# Patient Record
Sex: Female | Born: 1950 | Race: Black or African American | Hispanic: No | Marital: Married | Smoking: Current every day smoker
Health system: Southern US, Community
[De-identification: ages and names within clinical notes are randomized; demographics above are authoritative.]

## PROBLEM LIST (undated history)

## (undated) DIAGNOSIS — I10 Essential (primary) hypertension: Secondary | ICD-10-CM

## (undated) DIAGNOSIS — R319 Hematuria, unspecified: Secondary | ICD-10-CM

## (undated) DIAGNOSIS — J45909 Unspecified asthma, uncomplicated: Secondary | ICD-10-CM

## (undated) HISTORY — PX: CYST REMOVAL HAND: SHX6279

## (undated) HISTORY — PX: ABDOMINAL HYSTERECTOMY: SHX81

---

## 2000-03-09 HISTORY — PX: OTHER SURGICAL HISTORY: SHX169

## 2003-03-10 HISTORY — PX: TOTAL KNEE ARTHROPLASTY: SHX125

## 2004-03-09 HISTORY — PX: BREAST LUMPECTOMY: SHX2

## 2011-11-11 ENCOUNTER — Other Ambulatory Visit: Payer: Self-pay | Admitting: Urology

## 2011-11-20 ENCOUNTER — Encounter (HOSPITAL_BASED_OUTPATIENT_CLINIC_OR_DEPARTMENT_OTHER): Payer: Self-pay | Admitting: *Deleted

## 2011-11-20 NOTE — Progress Notes (Addendum)
NPO AFTER MN. ARRIVES AT 0715. NEEDS ISTAT AND EKG. WILL TAKE SINGULAIR AND DO ADVAIR INHALER AM OF SURG W/ SIPS OF WATER.  FYI:  PT STATES HAS SMALL  VEINS AND HAS BEEN HARD STICK IN THE PAST.

## 2011-11-24 NOTE — H&P (Signed)
ctive Problems Problems  1. Abdominal Pain In The Right Upper Belly (RUQ) 789.01 2. Microscopic Hematuria 599.72  History of Present Illness     Kristy Young returns today in f/u from her CT scan for possible cystoscopy for evaluation of her hematuria.  A cytology was negative.  I reviewed the CT and am concerned about some attenuation of the right renal pelvis that may be associated with a hilar mass effect.   She has no voiding symptoms at this time.  She had some right upper abdominal tenderness on her prior exam but has no complaints of pain today.  Her UA today has 11-20 RBC's.   Past Medical History Problems  1. History of  Asthma 493.90 2. History of  Hypercholesterolemia 272.0 3. History of  Hypertension 401.9  Surgical History Problems  1. History of  Breast Surgery 2. History of  Hysterectomy V45.77 3. History of  Knee Replacement  Current Meds 1. Advair Diskus AEPB; Therapy: (Recorded:31Jul2013) to 2. Albuterol Sulfate NEBU; Therapy: (Recorded:31Jul2013) to 3. Losartan Potassium-HCTZ 50-12.5 MG Oral Tablet; Therapy: (Recorded:31Jul2013) to 4. Singulair 10 MG Oral Tablet; Therapy: (Recorded:31Jul2013) to  Allergies Medication  1. Sulfa Drugs  Family History Problems  1. Family history of  Family Health Status Number Of Children 2 sons and 1 daughter 2. Paternal grandfather's history of  Heart Disease V17.49 3. Paternal history of  Lung Cancer V16.1 4. Maternal history of  Lung Surgery  Social History Problems  1. Being A Social Drinker 2. Caffeine Use 2 per day 3. Marital History - Single 4. Occupation: Print production planner 5. Tobacco Use 305.1 Smokes about 1/2 ppd for 15 yrs   She continues to smoke but is trying to cut back.   Review of Systems  Genitourinary: no hematuria.  Cardiovascular: no chest pain.  Respiratory: no shortness of breath.  Musculoskeletal: back pain.    Vitals Vital Signs [Data Includes: Last 1 Day]  04Sep2013 08:52AM  Blood Pressure:  143 / 75 Temperature: 97 F Heart Rate: 71  Physical Exam Constitutional: Well nourished and well developed . No acute distress.  Pulmonary: No respiratory distress and normal respiratory rhythm and effort.  Cardiovascular: Heart rate and rhythm are normal . No peripheral edema.    Results/Data Urine [Data Includes: Last 1 Day]   04Sep2013  COLOR YELLOW   APPEARANCE CLEAR   SPECIFIC GRAVITY 1.025   pH 6.0   GLUCOSE NEG mg/dL  BILIRUBIN NEG   KETONE NEG mg/dL  BLOOD LARGE   PROTEIN NEG mg/dL  UROBILINOGEN 0.2 mg/dL  NITRITE NEG   LEUKOCYTE ESTERASE NEG   SQUAMOUS EPITHELIAL/HPF NONE SEEN   WBC NONE SEEN WBC/hpf  RBC 11-20 RBC/hpf  BACTERIA NONE SEEN   CRYSTALS NONE SEEN   CASTS NONE SEEN   Other MUCUS    The following images/tracing/specimen were independently visualized:  I have reviewed the CT films and I am concerned about some attenuation of the right collecting system, particularly the pelvis, which is a bit ratty in appearance and could be associated with a hilar mass effect. Selected Results  AU CT-HEMATURIA PROTOCOL 01Aug2013 12:00AM Bjorn Pippin   Test Name Result Flag Reference  ** RADIOLOGY REPORT BY [facility] ** ORIGINAL APPROVED BY: P. Loralie Champagne, M.D. ON: 10/08/2011 16:48:37   *RADIOLOGY REPORT*  Clinical Data: Right flank pain and hematuria.  CT ABDOMEN AND PELVIS WITHOUT AND WITH CONTRAST  Technique: Multidetector CT imaging of the abdomen and pelvis was performed without contrast material in one or both  body regions, followed by contrast material(s) and further sections in one or both body regions.  Contrast: 125 ml Isovue 300.  Comparison: None  Findings: The lung bases are clear. No pulmonary nodule or pleural effusion.  No renal, ureteral or bladder calculi. After contrast administration both kidneys demonstrate normal perfusion. There are small bilateral simple appearing renal cysts. No worrisome renal mass lesions. The  delayed images demonstrate no worrisome collecting system abnormalities. Both ureters are visualized all the way down to the bladder and are unremarkable. The bladder is normal. No bladder mass or bladder wall thickening.  The liver is unremarkable. No focal lesions or intrahepatic biliary dilatation. The gallbladder is mildly contracted. No common bile duct dilatation. The pancreatic duct is within normal limits in caliber and has a normal course. No pancreatic lesions. The spleen is normal in size. No focal lesions. Small bilateral adrenal gland nodules are consistent with benign adenomas.  The stomach, duodenum, small bowel and colon are unremarkable. No mass lesions or inflammatory changes. The appendix is normal. No mesenteric or retroperitoneal mass or adenopathy. The aorta demonstrates advanced atherosclerotic calcifications but no focal aneurysm or dissection. The major branch vessels are patent. Calcifications noted at the major branch vessel ostia.  No pelvic mass, adenopathy or free pelvic fluid collections. The uterus is surgically absent. No inguinal mass or hernia.  The bony structures are unremarkable.  IMPRESSION:  1. Small bilateral renal cysts but no worrisome renal lesions, collecting system abnormalities or renal calculi. 2. No ureteral or bladder calculi. No bladder mass or wall thickening. 3. Advanced atherosclerotic changes involving the aorta and iliac arteries.   URINE CYTOLOGY 31Jul2013 04:14PM Bjorn Pippin  SPECIMEN TYPE: CLEAN CATCH   Test Name Result Flag Reference  FINAL DIAGNOSIS:     - NO MALIGNANT CELLS IDENTIFIED. - BENIGN UROTHELIAL CELLS ARE PRESENT.  SOURCE: Urine: Voided    90CC OF YELLOW LIQUID RECVD 1 SLIDE PREPARED [id number]  MICROSCOPIC HEMATURIA  PATHOLOGIST:     Reviewed by Margaretha Glassing. Darrick Penna, MD, FCAP Proofreader on File)  1 Container Submitted  CYTOTECHNOLOGIST:     Ethelle Lyon, MS CT (ASCP)    Assessment Assessed  1. Microscopic Hematuria 599.72 2. Abdominal Pain In The Right Upper Belly (RUQ) 789.01      She still has hematuria and may have a right renal pelvic lesion. She has marked atherosclerosis on CT.   Plan Health Maintenance (V70.0)  1. UA With REFLEX  Done: 04Sep2013 08:39AM Microscopic Hematuria (599.72)  2. Cysto 3. Follow-up Schedule Surgery Office  Follow-up  Requested for: 04Sep2013      I discussed the findings and will get her set up for cystoscopy with right RTG and possible ureteroscopy with biopsy and stent.  I reviewed the risks of bleeding, infection, ureteral injury, need for a stent or secondary procedures, thrombotic events and anesthetic complications.  I discussed smoking cessation with her as well.   Discussion/Summary   CC: Dr. Larita Fife.

## 2011-11-24 NOTE — Anesthesia Preprocedure Evaluation (Addendum)
Anesthesia Evaluation  Patient identified by MRN, date of birth, ID band Patient awake    Reviewed: Allergy & Precautions, H&P , NPO status , Patient's Chart, lab work & pertinent test results  Airway Mallampati: II TM Distance: >3 FB Neck ROM: full    Dental  (+) Dental Advisory Given, Edentulous Upper and Edentulous Lower   Pulmonary neg pulmonary ROS, asthma ,  URTI induced asthma. breath sounds clear to auscultation  Pulmonary exam normal       Cardiovascular Exercise Tolerance: Good hypertension, Pt. on medications negative cardio ROS  Rhythm:regular Rate:Normal     Neuro/Psych negative neurological ROS  negative psych ROS   GI/Hepatic negative GI ROS, Neg liver ROS,   Endo/Other  negative endocrine ROS  Renal/GU negative Renal ROS  negative genitourinary   Musculoskeletal   Abdominal   Peds  Hematology negative hematology ROS (+)   Anesthesia Other Findings   Reproductive/Obstetrics negative OB ROS                          Anesthesia Physical Anesthesia Plan  ASA: II  Anesthesia Plan: General   Post-op Pain Management:    Induction: Intravenous  Airway Management Planned: LMA  Additional Equipment:   Intra-op Plan:   Post-operative Plan:   Informed Consent: I have reviewed the patients History and Physical, chart, labs and discussed the procedure including the risks, benefits and alternatives for the proposed anesthesia with the patient or authorized representative who has indicated his/her understanding and acceptance.   Dental Advisory Given  Plan Discussed with: CRNA and Surgeon  Anesthesia Plan Comments:         Anesthesia Quick Evaluation

## 2011-11-25 ENCOUNTER — Encounter (HOSPITAL_BASED_OUTPATIENT_CLINIC_OR_DEPARTMENT_OTHER)
Admission: RE | Disposition: A | Discharge status: Home / Self Care | Payer: Self-pay | Source: Ambulatory Visit | Attending: Urology

## 2011-11-25 ENCOUNTER — Encounter (HOSPITAL_BASED_OUTPATIENT_CLINIC_OR_DEPARTMENT_OTHER): Payer: Self-pay | Admitting: *Deleted

## 2011-11-25 ENCOUNTER — Ambulatory Visit (HOSPITAL_BASED_OUTPATIENT_CLINIC_OR_DEPARTMENT_OTHER): Payer: BC Managed Care – PPO | Admitting: Anesthesiology

## 2011-11-25 ENCOUNTER — Ambulatory Visit (HOSPITAL_BASED_OUTPATIENT_CLINIC_OR_DEPARTMENT_OTHER)
Admission: RE | Admit: 2011-11-25 | Discharge: 2011-11-25 | Disposition: A | Discharge status: Home / Self Care | Payer: BC Managed Care – PPO | Source: Ambulatory Visit | Attending: Urology | Admitting: Urology

## 2011-11-25 ENCOUNTER — Encounter (HOSPITAL_BASED_OUTPATIENT_CLINIC_OR_DEPARTMENT_OTHER): Payer: Self-pay | Admitting: Anesthesiology

## 2011-11-25 DIAGNOSIS — R3129 Other microscopic hematuria: Secondary | ICD-10-CM | POA: Insufficient documentation

## 2011-11-25 HISTORY — DX: Hematuria, unspecified: R31.9

## 2011-11-25 HISTORY — DX: Unspecified asthma, uncomplicated: J45.909

## 2011-11-25 HISTORY — DX: Essential (primary) hypertension: I10

## 2011-11-25 LAB — POCT I-STAT 4, (NA,K, GLUC, HGB,HCT)
Glucose, Bld: 108 mg/dL — ABNORMAL HIGH (ref 70–99)
HCT: 42 % (ref 36.0–46.0)
Hemoglobin: 14.3 g/dL (ref 12.0–15.0)

## 2011-11-25 SURGERY — CYSTOURETEROSCOPY, WITH RETROGRADE PYELOGRAM AND STENT INSERTION
Anesthesia: General | Site: Ureter | Laterality: Right | Wound class: Clean Contaminated

## 2011-11-25 MED ORDER — ACETAMINOPHEN 650 MG RE SUPP: 650.0000 mg | RECTAL | Status: DC | PRN

## 2011-11-25 MED ORDER — FENTANYL CITRATE 0.05 MG/ML IJ SOLN: 25.0000 ug | INTRAMUSCULAR | Status: DC | PRN

## 2011-11-25 MED ORDER — DEXAMETHASONE SODIUM PHOSPHATE 4 MG/ML IJ SOLN
INTRAMUSCULAR | Status: DC | PRN
  Administered 2011-11-25: 10 mg via INTRAVENOUS

## 2011-11-25 MED ORDER — MIDAZOLAM HCL 5 MG/5ML IJ SOLN
INTRAMUSCULAR | Status: DC | PRN
  Administered 2011-11-25: 2 mg via INTRAVENOUS

## 2011-11-25 MED ORDER — LACTATED RINGERS IV SOLN
INTRAVENOUS | Status: DC
  Administered 2011-11-25: 08:00:00 via INTRAVENOUS

## 2011-11-25 MED ORDER — IOHEXOL 350 MG/ML SOLN
INTRAVENOUS | Status: DC | PRN
  Administered 2011-11-25: 8 mL via INTRAVENOUS

## 2011-11-25 MED ORDER — ONDANSETRON HCL 4 MG/2ML IJ SOLN
INTRAMUSCULAR | Status: DC | PRN
  Administered 2011-11-25: 4 mg via INTRAVENOUS

## 2011-11-25 MED ORDER — CIPROFLOXACIN IN D5W 400 MG/200ML IV SOLN
400.0000 mg | INTRAVENOUS | Status: AC
  Administered 2011-11-25: 400 mg via INTRAVENOUS

## 2011-11-25 MED ORDER — ONDANSETRON HCL 4 MG/2ML IJ SOLN: 4.0000 mg | Freq: Four times a day (QID) | INTRAMUSCULAR | Status: DC | PRN

## 2011-11-25 MED ORDER — SODIUM CHLORIDE 0.9 % IJ SOLN: 3.0000 mL | Freq: Two times a day (BID) | INTRAMUSCULAR | Status: DC

## 2011-11-25 MED ORDER — OXYCODONE HCL 5 MG PO TABS: 5.0000 mg | ORAL_TABLET | ORAL | Status: DC | PRN

## 2011-11-25 MED ORDER — LACTATED RINGERS IV SOLN: INTRAVENOUS | Status: DC

## 2011-11-25 MED ORDER — ALBUTEROL SULFATE HFA 108 (90 BASE) MCG/ACT IN AERS
INHALATION_SPRAY | RESPIRATORY_TRACT | Status: DC | PRN
  Administered 2011-11-25: 2 via RESPIRATORY_TRACT

## 2011-11-25 MED ORDER — BELLADONNA ALKALOIDS-OPIUM 16.2-60 MG RE SUPP
RECTAL | Status: DC | PRN
  Administered 2011-11-25: 1 via RECTAL

## 2011-11-25 MED ORDER — SODIUM CHLORIDE 0.9 % IV SOLN: 250.0000 mL | INTRAVENOUS | Status: DC | PRN

## 2011-11-25 MED ORDER — SODIUM CHLORIDE 0.9 % IR SOLN
Status: DC | PRN
  Administered 2011-11-25: 3000 mL

## 2011-11-25 MED ORDER — PROPOFOL 10 MG/ML IV BOLUS
INTRAVENOUS | Status: DC | PRN
  Administered 2011-11-25: 170 mg via INTRAVENOUS

## 2011-11-25 MED ORDER — LIDOCAINE HCL (CARDIAC) 20 MG/ML IV SOLN
INTRAVENOUS | Status: DC | PRN
  Administered 2011-11-25: 70 mg via INTRAVENOUS

## 2011-11-25 MED ORDER — SODIUM CHLORIDE 0.9 % IJ SOLN: 3.0000 mL | INTRAMUSCULAR | Status: DC | PRN

## 2011-11-25 MED ORDER — ACETAMINOPHEN 325 MG PO TABS
650.0000 mg | ORAL_TABLET | ORAL | Status: DC | PRN
  Administered 2011-11-25: 650 mg via ORAL

## 2011-11-25 MED ORDER — FENTANYL CITRATE 0.05 MG/ML IJ SOLN
INTRAMUSCULAR | Status: DC | PRN
  Administered 2011-11-25: 25 ug via INTRAVENOUS

## 2011-11-25 SURGICAL SUPPLY — 28 items
BAG DRAIN URO-CYSTO SKYTR STRL (DRAIN) ×2 IMPLANT
BASKET LASER NITINOL 1.9FR (BASKET) IMPLANT
BASKET ZERO TIP NITINOL 2.4FR (BASKET) IMPLANT
BRUSH URET BIOPSY 3F (UROLOGICAL SUPPLIES) IMPLANT
CANISTER SUCT LVC 12 LTR MEDI- (MISCELLANEOUS) ×2 IMPLANT
CATH URET 5FR 28IN CONE TIP (BALLOONS)
CATH URET 5FR 28IN OPEN ENDED (CATHETERS) ×2 IMPLANT
CATH URET 5FR 70CM CONE TIP (BALLOONS) IMPLANT
CLOTH BEACON ORANGE TIMEOUT ST (SAFETY) ×2 IMPLANT
DRAPE CAMERA CLOSED 9X96 (DRAPES) ×2 IMPLANT
ELECT REM PT RETURN 9FT ADLT (ELECTROSURGICAL)
ELECTRODE REM PT RTRN 9FT ADLT (ELECTROSURGICAL) IMPLANT
GLOVE SURG SS PI 8.0 STRL IVOR (GLOVE) ×2 IMPLANT
GOWN PREVENTION PLUS LG XLONG (DISPOSABLE) ×4 IMPLANT
GOWN STRL REIN XL XLG (GOWN DISPOSABLE) ×2 IMPLANT
GUIDEWIRE 0.038 PTFE COATED (WIRE) IMPLANT
GUIDEWIRE ANG ZIPWIRE 038X150 (WIRE) IMPLANT
GUIDEWIRE STR DUAL SENSOR (WIRE) ×2 IMPLANT
IV NS IRRIG 3000ML ARTHROMATIC (IV SOLUTION) ×2 IMPLANT
KIT BALLIN UROMAX 15FX10 (LABEL) IMPLANT
KIT BALLN UROMAX 15FX4 (MISCELLANEOUS) IMPLANT
KIT BALLN UROMAX 26 75X4 (MISCELLANEOUS)
LASER FIBER DISP (UROLOGICAL SUPPLIES) IMPLANT
LASER FIBER DISP 1000U (UROLOGICAL SUPPLIES) IMPLANT
PACK CYSTOSCOPY (CUSTOM PROCEDURE TRAY) ×2 IMPLANT
SET HIGH PRES BAL DIL (LABEL)
SHEATH ACCESS URETERAL 38CM (SHEATH) IMPLANT
SHEATH ACCESS URETERAL 54CM (SHEATH) IMPLANT

## 2011-11-25 NOTE — Anesthesia Postprocedure Evaluation (Signed)
  Anesthesia Post-op Note  Patient: Kristy Young  Procedure(s) Performed: Procedure(s) (LRB): CYSTOSCOPY WITH RETROGRADE PYELOGRAM, URETEROSCOPY AND STENT PLACEMENT (Right)  Patient Location: PACU  Anesthesia Type: General  Level of Consciousness: awake and alert   Airway and Oxygen Therapy: Patient Spontanous Breathing  Post-op Pain: mild  Post-op Assessment: Post-op Vital signs reviewed, Patient's Cardiovascular Status Stable, Respiratory Function Stable, Patent Airway and No signs of Nausea or vomiting  Post-op Vital Signs: stable  Complications: No apparent anesthesia complications

## 2011-11-25 NOTE — Transfer of Care (Signed)
Immediate Anesthesia Transfer of Care Note  Patient: Kristy Young  Procedure(s) Performed: Procedure(s) (LRB) with comments: CYSTOSCOPY WITH RETROGRADE PYELOGRAM, URETEROSCOPY AND STENT PLACEMENT (Right) - CYSTO , RIGHT RETROGRADE PYELOGRAM POSS URETEROSCOPY AND BIOPSY AND STENT   C Arm digital ureteroscope  Patient Location: PACU  Anesthesia Type: General  Level of Consciousness: awake, alert  and oriented  Airway & Oxygen Therapy: Patient Spontanous Breathing and Patient connected to nasal cannula oxygen  Post-op Assessment: Report given to PACU RN  Post vital signs: Reviewed and stable  Complications: No apparent anesthesia complications

## 2011-11-25 NOTE — Progress Notes (Signed)
Dentures returned to pt 

## 2011-11-25 NOTE — Brief Op Note (Signed)
11/25/2011  8:53 AM  PATIENT:  Kristy Young  61 y.o. female  PRE-OPERATIVE DIAGNOSIS:  HEMATURIA AND POSSIBLE RT RENAL FILLING DEFECT  POST-OPERATIVE DIAGNOSIS:  HEMATURIA AND POSSIBLE RT RENAL FILLING DEFECT  PROCEDURE:  Procedure(s) (LRB) with comments: CYSTOSCOPY WITH RETROGRADE PYELOGRAM, URETEROSCOPY AND STENT PLACEMENT (Right)   C Arm digital ureteroscope  SURGEON:  Surgeon(s) and Role:    * Anner Crete, MD - Primary  PHYSICIAN ASSISTANT:   ASSISTANTS: none   ANESTHESIA:   general  EBL:  Total I/O In: 100 [I.V.:100] Out: -   BLOOD ADMINISTERED:none  DRAINS: none   LOCAL MEDICATIONS USED:  NONE  SPECIMEN:  No Specimen  DISPOSITION OF SPECIMEN:  N/A  COUNTS:  YES  TOURNIQUET:  * No tourniquets in log *  DICTATION: .Other Dictation: Dictation Number [id number]  PLAN OF CARE: Discharge to home after PACU  PATIENT DISPOSITION:  PACU - hemodynamically stable.   Delay start of Pharmacological VTE agent (>24hrs) due to surgical blood loss or risk of bleeding: no

## 2011-11-25 NOTE — Anesthesia Procedure Notes (Signed)
Procedure Name: LMA Insertion Date/Time: 11/25/2011 8:33 AM Performed by: Maris Berger T Pre-anesthesia Checklist: Patient identified, Emergency Drugs available, Suction available and Patient being monitored Patient Re-evaluated:Patient Re-evaluated prior to inductionOxygen Delivery Method: Circle System Utilized Preoxygenation: Pre-oxygenation with 100% oxygen Intubation Type: IV induction Ventilation: Mask ventilation without difficulty LMA: LMA inserted LMA Size: 4.0 Number of attempts: 1 Placement Confirmation: positive ETCO2 Tube secured with: Tape Dental Injury: Teeth and Oropharynx as per pre-operative assessment  Comments: GAuze roll between gums

## 2011-11-25 NOTE — Interval H&P Note (Signed)
History and Physical Interval Note:  11/25/2011 8:14 AM  Kristy Young  has presented today for surgery, with the diagnosis of HEMATURIA AND POSSIBLE RT RENAL FILLING DEFECT  The various methods of treatment have been discussed with the patient and family. After consideration of risks, benefits and other options for treatment, the patient has consented to  Procedure(s) (LRB) with comments: CYSTOSCOPY WITH RETROGRADE PYELOGRAM, URETEROSCOPY AND STENT PLACEMENT (Right) - CYSTO , RIGHT RETROGRADE PYELOGRAM POSS URETEROSCOPY AND BIOPSY AND STENT   C Arm digital ureteroscope as a surgical intervention .  The patient's history has been reviewed, patient examined, no change in status, stable for surgery.  I have reviewed the patient's chart and labs.  Questions were answered to the patient's satisfaction.     [name]

## 2011-11-26 NOTE — Op Note (Addendum)
Kristy Young, Kristy Young                   ACCOUNT NO.:  [id number]  MEDICAL RECORD NO.:  [id number]  LOCATION:                               FACILITY:  [facility]  PHYSICIAN:  Excell Seltzer. Annabell Howells, M.D.    DATE OF BIRTH:  Mar 30, 1950  DATE OF PROCEDURE:  11/25/2011 DATE OF DISCHARGE:                              OPERATIVE REPORT   PROCEDURE:  Cystoscopy, right retrograde pyelogram with interpretation.  PREOPERATIVE DIAGNOSIS:  Microhematuria with possible right renal filling defect.  POSTOPERATIVE DIAGNOSIS:  Microhematuria with normal right internal collecting system.  SURGEON:  Excell Seltzer. Annabell Howells, M.D.  ANESTHESIA:  General.  SPECIMEN:  None.  DRAINS:  None.  COMPLICATIONS:  None.  INDICATIONS:  [name] is a 61 year old African American female, who was seen in consultation for microhematuria.  Her hematuria CT was read as normal, but in the right kidney I was concerned about some attenuation in the pelvis that was worrisome for possible filling defect, and it was felt that cysto and retrograde pyelography was indicated to complete her evaluation.  Of note, her cytology was negative.  FINDINGS AND PROCEDURE:  She was given Cipro.  She was taken to the operating room where general anesthetic was induced.  She was placed in lithotomy position, fitted with PAS hose.  Perineum and genitalia were prepped with Betadine solution.  She was draped in usual sterile fashion.  Cystoscopy was performed using a 22-French scope and 12-degree lens. Inspection revealed normal ureters.  The bladder wall was smooth and pale without tumor, stones, or inflammation, and could be completely visualized with 12 degree lens.  The right ureteral orifice was cannulated with 5-French open-end catheter and contrast was instilled.  This study demonstrated a normal ureter.  The internal collecting system had some infundibular narrowing just off the pelvis in several infundibula, but with increased pressure of contrast  the infundibula did dilate and no filling defects or worrisome areas were noted.  After completion of the retrograde pyelogram.  The bladder was drained. The cystoscope was removed.  The patient was taken down from lithotomy position.  Her anesthetic was reversed.  She was moved to recovery room in stable condition.  There were no complications.     Excell Seltzer. Annabell Howells, M.D.     JJW/MEDQ  D:  11/25/2011  T:  11/26/2011  Job:  [id number]

## 2011-12-01 ENCOUNTER — Encounter (HOSPITAL_BASED_OUTPATIENT_CLINIC_OR_DEPARTMENT_OTHER): Payer: Self-pay

## 2012-02-22 ENCOUNTER — Other Ambulatory Visit (HOSPITAL_COMMUNITY): Payer: Self-pay | Admitting: Orthopedic Surgery

## 2012-02-22 DIAGNOSIS — M25562 Pain in left knee: Secondary | ICD-10-CM

## 2012-03-07 ENCOUNTER — Encounter (HOSPITAL_COMMUNITY)
Admission: RE | Admit: 2012-03-07 | Discharge: 2012-03-07 | Disposition: A | Discharge status: Home / Self Care | Payer: BC Managed Care – PPO | Source: Ambulatory Visit | Attending: Orthopedic Surgery | Admitting: Orthopedic Surgery

## 2012-03-07 DIAGNOSIS — M25562 Pain in left knee: Secondary | ICD-10-CM

## 2012-03-07 DIAGNOSIS — M25569 Pain in unspecified knee: Secondary | ICD-10-CM | POA: Insufficient documentation

## 2012-03-07 DIAGNOSIS — Z96659 Presence of unspecified artificial knee joint: Secondary | ICD-10-CM | POA: Insufficient documentation

## 2012-03-07 MED ORDER — TECHNETIUM TC 99M MEDRONATE IV KIT
25.0000 | PACK | Freq: Once | INTRAVENOUS | Status: AC | PRN
  Administered 2012-03-07: 25 via INTRAVENOUS

## 2012-04-26 ENCOUNTER — Other Ambulatory Visit: Payer: Self-pay | Admitting: Orthopedic Surgery

## 2012-04-27 ENCOUNTER — Encounter (HOSPITAL_COMMUNITY): Payer: Self-pay

## 2012-04-29 ENCOUNTER — Encounter (HOSPITAL_COMMUNITY): Payer: Self-pay

## 2012-04-29 ENCOUNTER — Encounter (HOSPITAL_COMMUNITY)
Admission: RE | Admit: 2012-04-29 | Discharge: 2012-04-29 | Disposition: A | Discharge status: Home / Self Care | Payer: BC Managed Care – PPO | Source: Ambulatory Visit | Attending: Orthopedic Surgery | Admitting: Orthopedic Surgery

## 2012-04-29 LAB — SURGICAL PCR SCREEN: Staphylococcus aureus: NEGATIVE

## 2012-04-29 LAB — COMPREHENSIVE METABOLIC PANEL
AST: 15 U/L (ref 0–37)
Albumin: 4.3 g/dL (ref 3.5–5.2)
Alkaline Phosphatase: 62 U/L (ref 39–117)
BUN: 10 mg/dL (ref 6–23)
CO2: 26 mEq/L (ref 19–32)
Chloride: 102 mEq/L (ref 96–112)
GFR calc non Af Amer: 87 mL/min — ABNORMAL LOW (ref >90)
Potassium: 3.9 mEq/L (ref 3.5–5.1)
Total Bilirubin: 0.3 mg/dL (ref 0.3–1.2)

## 2012-04-29 LAB — URINALYSIS, ROUTINE W REFLEX MICROSCOPIC
Leukocytes, UA: NEGATIVE
Nitrite: NEGATIVE
Specific Gravity, Urine: 1.021 (ref 1.005–1.030)
pH: 5.5 (ref 5.0–8.0)

## 2012-04-29 LAB — CBC WITH DIFFERENTIAL/PLATELET
Basophils Absolute: 0 10*3/uL (ref 0.0–0.1)
Basophils Relative: 0 % (ref 0–1)
HCT: 38.1 % (ref 36.0–46.0)
Hemoglobin: 13 g/dL (ref 12.0–15.0)
Lymphocytes Relative: 27 % (ref 12–46)
MCHC: 34.1 g/dL (ref 30.0–36.0)
Monocytes Relative: 4 % (ref 3–12)
Neutro Abs: 6.2 10*3/uL (ref 1.7–7.7)
Neutrophils Relative %: 68 % (ref 43–77)
WBC: 9.1 10*3/uL (ref 4.0–10.5)

## 2012-04-29 LAB — TYPE AND SCREEN

## 2012-04-29 LAB — URINE MICROSCOPIC-ADD ON

## 2012-04-29 LAB — APTT: aPTT: 31 seconds (ref 24–37)

## 2012-04-29 NOTE — Pre-Procedure Instructions (Signed)
Kristy Young  04/29/2012   Your procedure is scheduled on:  Friday, February 28th  Report to [facility] at 0930 AM.  Call this number if you have problems the morning of surgery: [phone]   Remember:   Do not eat food or drink liquids after midnight.    Take these medicines the morning of surgery with A SIP OF WATER: singulair, inhalers, vicodin if needed   Do not wear jewelry, make-up or nail polish.  Do not wear lotions, powders, or perfumes, deodorant.  Do not shave 48 hours prior to surgery. Men may shave face and neck.  Do not bring valuables to the hospital.  Contacts, dentures or bridgework may not be worn into surgery.  Leave suitcase in the car. After surgery it may be brought to your room.  For patients admitted to the hospital, checkout time is 11:00 AM the day of discharge.   Patients discharged the day of surgery will not be allowed to drive home.    Special Instructions: Shower using CHG 2 nights before surgery and the night before surgery.  If you shower the day of surgery use CHG.  Use special wash - you have one bottle of CHG for all showers.  You should use approximately 1/3 of the bottle for each shower.   Please read over the following fact sheets that you were given: Pain Booklet, Coughing and Deep Breathing, Blood Transfusion Information, MRSA Information and Surgical Site Infection Prevention

## 2012-04-29 NOTE — Progress Notes (Signed)
Primary Physician - Dr. Cyndia Bent Does not have a cardiologist ekg in epic from sept 2013

## 2012-05-05 MED ORDER — CEFAZOLIN SODIUM-DEXTROSE 2-3 GM-% IV SOLR
2.0000 g | INTRAVENOUS | Status: AC
  Administered 2012-05-06: 2 g via INTRAVENOUS
  Filled 2012-05-05: qty 50

## 2012-05-06 ENCOUNTER — Encounter (HOSPITAL_COMMUNITY): Payer: Self-pay | Admitting: Anesthesiology

## 2012-05-06 ENCOUNTER — Encounter (HOSPITAL_COMMUNITY)
Admission: RE | Disposition: A | Discharge status: Home Health | Payer: Self-pay | Source: Ambulatory Visit | Attending: Orthopedic Surgery

## 2012-05-06 ENCOUNTER — Inpatient Hospital Stay (HOSPITAL_COMMUNITY): Payer: BC Managed Care – PPO | Admitting: Anesthesiology

## 2012-05-06 DIAGNOSIS — T84039A Mechanical loosening of unspecified internal prosthetic joint, initial encounter: Principal | ICD-10-CM | POA: Diagnosis present

## 2012-05-06 DIAGNOSIS — Z96659 Presence of unspecified artificial knee joint: Secondary | ICD-10-CM

## 2012-05-06 DIAGNOSIS — Z01818 Encounter for other preprocedural examination: Secondary | ICD-10-CM

## 2012-05-06 DIAGNOSIS — Z01812 Encounter for preprocedural laboratory examination: Secondary | ICD-10-CM

## 2012-05-06 DIAGNOSIS — I10 Essential (primary) hypertension: Secondary | ICD-10-CM | POA: Diagnosis present

## 2012-05-06 DIAGNOSIS — Y831 Surgical operation with implant of artificial internal device as the cause of abnormal reaction of the patient, or of later complication, without mention of misadventure at the time of the procedure: Secondary | ICD-10-CM | POA: Diagnosis present

## 2012-05-06 DIAGNOSIS — Z79899 Other long term (current) drug therapy: Secondary | ICD-10-CM

## 2012-05-06 DIAGNOSIS — J45909 Unspecified asthma, uncomplicated: Secondary | ICD-10-CM | POA: Diagnosis present

## 2012-05-06 DIAGNOSIS — T84093A Other mechanical complication of internal left knee prosthesis, initial encounter: Secondary | ICD-10-CM

## 2012-05-06 DIAGNOSIS — F17200 Nicotine dependence, unspecified, uncomplicated: Secondary | ICD-10-CM | POA: Diagnosis present

## 2012-05-06 HISTORY — PX: TOTAL KNEE REVISION: SHX996

## 2012-05-06 LAB — GRAM STAIN

## 2012-05-06 SURGERY — TOTAL KNEE REVISION
Anesthesia: General | Site: Knee | Laterality: Left | Wound class: Clean

## 2012-05-06 MED ORDER — WHITE PETROLATUM GEL
Status: AC
  Administered 2012-05-06: 21:00:00
  Filled 2012-05-06: qty 5

## 2012-05-06 MED ORDER — NEOSTIGMINE METHYLSULFATE 1 MG/ML IJ SOLN
INTRAMUSCULAR | Status: DC | PRN
  Administered 2012-05-06: 5 mg via INTRAVENOUS

## 2012-05-06 MED ORDER — LIDOCAINE HCL (CARDIAC) 20 MG/ML IV SOLN
INTRAVENOUS | Status: DC | PRN
  Administered 2012-05-06: 80 mg via INTRAVENOUS

## 2012-05-06 MED ORDER — FENTANYL CITRATE 0.05 MG/ML IJ SOLN
INTRAMUSCULAR | Status: DC | PRN
  Administered 2012-05-06 (×2): 100 ug via INTRAVENOUS
  Administered 2012-05-06: 150 ug via INTRAVENOUS

## 2012-05-06 MED ORDER — ARTIFICIAL TEARS OP OINT
TOPICAL_OINTMENT | OPHTHALMIC | Status: DC | PRN
  Administered 2012-05-06: 1 via OPHTHALMIC

## 2012-05-06 MED ORDER — HYDROMORPHONE HCL PF 1 MG/ML IJ SOLN
0.2500 mg | INTRAMUSCULAR | Status: DC | PRN
  Administered 2012-05-06 (×4): 0.5 mg via INTRAVENOUS

## 2012-05-06 MED ORDER — MONTELUKAST SODIUM 10 MG PO TABS
10.0000 mg | ORAL_TABLET | Freq: Every day | ORAL | Status: DC
  Administered 2012-05-07 – 2012-05-09 (×3): 10 mg via ORAL
  Filled 2012-05-06 (×4): qty 1

## 2012-05-06 MED ORDER — ACETAMINOPHEN 10 MG/ML IV SOLN
1000.0000 mg | Freq: Four times a day (QID) | INTRAVENOUS | Status: AC
  Administered 2012-05-06 – 2012-05-07 (×3): 1000 mg via INTRAVENOUS
  Filled 2012-05-06 (×4): qty 100

## 2012-05-06 MED ORDER — METHOCARBAMOL 100 MG/ML IJ SOLN
500.0000 mg | Freq: Four times a day (QID) | INTRAVENOUS | Status: DC | PRN
  Filled 2012-05-06: qty 5

## 2012-05-06 MED ORDER — WARFARIN SODIUM 5 MG PO TABS: ORAL_TABLET | ORAL | Status: DC

## 2012-05-06 MED ORDER — PNEUMOCOCCAL VAC POLYVALENT 25 MCG/0.5ML IJ INJ
0.5000 mL | INJECTION | INTRAMUSCULAR | Status: AC
  Administered 2012-05-07: 0.5 mL via INTRAMUSCULAR
  Filled 2012-05-06: qty 0.5

## 2012-05-06 MED ORDER — OXYCODONE HCL 5 MG PO TABS: 5.0000 mg | ORAL_TABLET | Freq: Once | ORAL | Status: DC | PRN

## 2012-05-06 MED ORDER — HYDROMORPHONE HCL PF 1 MG/ML IJ SOLN
INTRAMUSCULAR | Status: AC
  Filled 2012-05-06: qty 1

## 2012-05-06 MED ORDER — ALUM & MAG HYDROXIDE-SIMETH 200-200-20 MG/5ML PO SUSP: 30.0000 mL | ORAL | Status: DC | PRN

## 2012-05-06 MED ORDER — DEXAMETHASONE SODIUM PHOSPHATE 4 MG/ML IJ SOLN
INTRAMUSCULAR | Status: DC | PRN
  Administered 2012-05-06: 8 mg via INTRAVENOUS

## 2012-05-06 MED ORDER — DOCUSATE SODIUM 100 MG PO CAPS
100.0000 mg | ORAL_CAPSULE | Freq: Two times a day (BID) | ORAL | Status: DC
  Administered 2012-05-06 – 2012-05-09 (×6): 100 mg via ORAL
  Filled 2012-05-06 (×7): qty 1

## 2012-05-06 MED ORDER — ZOLPIDEM TARTRATE 5 MG PO TABS: 5.0000 mg | ORAL_TABLET | Freq: Every evening | ORAL | Status: DC | PRN

## 2012-05-06 MED ORDER — HYDROMORPHONE HCL PF 1 MG/ML IJ SOLN
1.0000 mg | INTRAMUSCULAR | Status: DC | PRN
  Administered 2012-05-06 (×2): 1 mg via INTRAVENOUS
  Administered 2012-05-07: 2 mg via INTRAVENOUS
  Filled 2012-05-06 (×2): qty 1
  Filled 2012-05-06: qty 2

## 2012-05-06 MED ORDER — METHOCARBAMOL 500 MG PO TABS
500.0000 mg | ORAL_TABLET | Freq: Four times a day (QID) | ORAL | Status: DC | PRN
  Administered 2012-05-06 – 2012-05-08 (×8): 500 mg via ORAL
  Filled 2012-05-06 (×7): qty 1

## 2012-05-06 MED ORDER — MIDAZOLAM HCL 5 MG/5ML IJ SOLN
INTRAMUSCULAR | Status: DC | PRN
  Administered 2012-05-06: 2 mg via INTRAVENOUS

## 2012-05-06 MED ORDER — LOSARTAN POTASSIUM-HCTZ 50-12.5 MG PO TABS: 1.0000 | ORAL_TABLET | Freq: Every day | ORAL | Status: DC

## 2012-05-06 MED ORDER — DEXAMETHASONE SODIUM PHOSPHATE 10 MG/ML IJ SOLN
INTRAMUSCULAR | Status: DC | PRN
  Administered 2012-05-06: 8 mg

## 2012-05-06 MED ORDER — ONDANSETRON HCL 4 MG PO TABS: 4.0000 mg | ORAL_TABLET | Freq: Four times a day (QID) | ORAL | Status: DC | PRN

## 2012-05-06 MED ORDER — METHOCARBAMOL 500 MG PO TABS
ORAL_TABLET | ORAL | Status: AC
  Filled 2012-05-06: qty 1

## 2012-05-06 MED ORDER — POLYETHYLENE GLYCOL 3350 17 G PO PACK
17.0000 g | PACK | Freq: Every day | ORAL | Status: DC | PRN
  Administered 2012-05-08: 17 g via ORAL
  Filled 2012-05-06: qty 1

## 2012-05-06 MED ORDER — WARFARIN SODIUM 5 MG PO TABS
5.0000 mg | ORAL_TABLET | Freq: Once | ORAL | Status: AC
  Administered 2012-05-06: 5 mg via ORAL
  Filled 2012-05-06: qty 1

## 2012-05-06 MED ORDER — DEXTROSE-NACL 5-0.45 % IV SOLN: INTRAVENOUS | Status: DC

## 2012-05-06 MED ORDER — GLYCOPYRROLATE 0.2 MG/ML IJ SOLN
INTRAMUSCULAR | Status: DC | PRN
  Administered 2012-05-06: 0.6 mg via INTRAVENOUS

## 2012-05-06 MED ORDER — LACTATED RINGERS IV SOLN
INTRAVENOUS | Status: DC | PRN
  Administered 2012-05-06 (×2): via INTRAVENOUS

## 2012-05-06 MED ORDER — SODIUM CHLORIDE 0.9 % IV SOLN
INTRAVENOUS | Status: DC
  Administered 2012-05-06: 18:00:00 via INTRAVENOUS

## 2012-05-06 MED ORDER — BUPIVACAINE-EPINEPHRINE PF 0.5-1:200000 % IJ SOLN
INTRAMUSCULAR | Status: DC | PRN
  Administered 2012-05-06: 30 mL

## 2012-05-06 MED ORDER — ACETAMINOPHEN 10 MG/ML IV SOLN
INTRAVENOUS | Status: DC | PRN
  Administered 2012-05-06: 1000 mg via INTRAVENOUS

## 2012-05-06 MED ORDER — METOCLOPRAMIDE HCL 5 MG/ML IJ SOLN: 5.0000 mg | Freq: Three times a day (TID) | INTRAMUSCULAR | Status: DC | PRN

## 2012-05-06 MED ORDER — DIPHENHYDRAMINE HCL 12.5 MG/5ML PO ELIX: 12.5000 mg | ORAL_SOLUTION | ORAL | Status: DC | PRN

## 2012-05-06 MED ORDER — HYDROCHLOROTHIAZIDE 12.5 MG PO CAPS
12.5000 mg | ORAL_CAPSULE | Freq: Every day | ORAL | Status: DC
  Administered 2012-05-07 – 2012-05-09 (×3): 12.5 mg via ORAL
  Filled 2012-05-06 (×4): qty 1

## 2012-05-06 MED ORDER — ONDANSETRON HCL 4 MG/2ML IJ SOLN: 4.0000 mg | Freq: Once | INTRAMUSCULAR | Status: DC | PRN

## 2012-05-06 MED ORDER — DEXTROSE 5 % IV SOLN
INTRAVENOUS | Status: DC | PRN
  Administered 2012-05-06: 12:00:00 via INTRAVENOUS

## 2012-05-06 MED ORDER — ALBUTEROL SULFATE (5 MG/ML) 0.5% IN NEBU
2.5000 mg | INHALATION_SOLUTION | Freq: Four times a day (QID) | RESPIRATORY_TRACT | Status: DC
  Administered 2012-05-06 – 2012-05-07 (×3): 2.5 mg via RESPIRATORY_TRACT
  Filled 2012-05-06 (×3): qty 0.5

## 2012-05-06 MED ORDER — CEFAZOLIN SODIUM-DEXTROSE 2-3 GM-% IV SOLR
2.0000 g | Freq: Four times a day (QID) | INTRAVENOUS | Status: AC
  Administered 2012-05-06 – 2012-05-07 (×2): 2 g via INTRAVENOUS
  Filled 2012-05-06 (×2): qty 50

## 2012-05-06 MED ORDER — ONDANSETRON HCL 4 MG/2ML IJ SOLN
INTRAMUSCULAR | Status: DC | PRN
  Administered 2012-05-06 (×2): 4 mg via INTRAVENOUS

## 2012-05-06 MED ORDER — PATIENT'S GUIDE TO USING COUMADIN BOOK
Freq: Once | Status: AC
  Administered 2012-05-06: 20:00:00
  Filled 2012-05-06: qty 1

## 2012-05-06 MED ORDER — METOCLOPRAMIDE HCL 10 MG PO TABS: 5.0000 mg | ORAL_TABLET | Freq: Three times a day (TID) | ORAL | Status: DC | PRN

## 2012-05-06 MED ORDER — OXYCODONE HCL 5 MG/5ML PO SOLN: 5.0000 mg | Freq: Once | ORAL | Status: DC | PRN

## 2012-05-06 MED ORDER — PROPOFOL 10 MG/ML IV BOLUS
INTRAVENOUS | Status: DC | PRN
  Administered 2012-05-06: 150 mg via INTRAVENOUS

## 2012-05-06 MED ORDER — FERROUS SULFATE 325 (65 FE) MG PO TABS
325.0000 mg | ORAL_TABLET | Freq: Two times a day (BID) | ORAL | Status: DC
  Administered 2012-05-07 – 2012-05-09 (×5): 325 mg via ORAL
  Filled 2012-05-06 (×8): qty 1

## 2012-05-06 MED ORDER — ONDANSETRON HCL 4 MG/2ML IJ SOLN
4.0000 mg | Freq: Four times a day (QID) | INTRAMUSCULAR | Status: DC | PRN
  Administered 2012-05-06 – 2012-05-07 (×2): 4 mg via INTRAVENOUS
  Filled 2012-05-06 (×2): qty 2

## 2012-05-06 MED ORDER — MOMETASONE FURO-FORMOTEROL FUM 100-5 MCG/ACT IN AERO
2.0000 | INHALATION_SPRAY | Freq: Two times a day (BID) | RESPIRATORY_TRACT | Status: DC
  Administered 2012-05-06 – 2012-05-09 (×6): 2 via RESPIRATORY_TRACT
  Filled 2012-05-06: qty 8.8

## 2012-05-06 MED ORDER — ACETAMINOPHEN 10 MG/ML IV SOLN
INTRAVENOUS | Status: AC
  Filled 2012-05-06: qty 100

## 2012-05-06 MED ORDER — POVIDONE-IODINE 7.5 % EX SOLN
Freq: Once | CUTANEOUS | Status: DC
  Filled 2012-05-06: qty 118

## 2012-05-06 MED ORDER — CEFUROXIME SODIUM 750 MG IJ SOLR
INTRAMUSCULAR | Status: AC
  Filled 2012-05-06: qty 750

## 2012-05-06 MED ORDER — ROCURONIUM BROMIDE 100 MG/10ML IV SOLN
INTRAVENOUS | Status: DC | PRN
  Administered 2012-05-06: 50 mg via INTRAVENOUS

## 2012-05-06 MED ORDER — OXYCODONE HCL 5 MG PO TABS
5.0000 mg | ORAL_TABLET | ORAL | Status: DC | PRN
Start: 2012-05-06 — End: 2012-05-09
  Administered 2012-05-06 – 2012-05-09 (×14): 10 mg via ORAL
  Filled 2012-05-06 (×14): qty 2

## 2012-05-06 MED ORDER — WARFARIN VIDEO: Freq: Once | Status: DC

## 2012-05-06 MED ORDER — OXYCODONE-ACETAMINOPHEN 5-325 MG PO TABS: 1.0000 | ORAL_TABLET | Freq: Four times a day (QID) | ORAL | Status: DC | PRN

## 2012-05-06 MED ORDER — STERILE WATER FOR IRRIGATION IR SOLN
Status: DC | PRN
  Administered 2012-05-06: 1000 mL

## 2012-05-06 MED ORDER — LOSARTAN POTASSIUM 50 MG PO TABS
50.0000 mg | ORAL_TABLET | Freq: Every day | ORAL | Status: DC
  Administered 2012-05-07 – 2012-05-09 (×3): 50 mg via ORAL
  Filled 2012-05-06 (×4): qty 1

## 2012-05-06 MED ORDER — CEFUROXIME SODIUM 750 MG IJ SOLR
INTRAMUSCULAR | Status: DC | PRN
  Administered 2012-05-06: 750 mg

## 2012-05-06 MED ORDER — WARFARIN - PHARMACIST DOSING INPATIENT: Freq: Every day | Status: DC

## 2012-05-06 SURGICAL SUPPLY — 74 items
BANDAGE ELASTIC 6 VELCRO ST LF (GAUZE/BANDAGES/DRESSINGS) ×2 IMPLANT
BANDAGE ESMARK 6X9 LF (GAUZE/BANDAGES/DRESSINGS) ×1 IMPLANT
BENZOIN TINCTURE PRP APPL 2/3 (GAUZE/BANDAGES/DRESSINGS) ×2 IMPLANT
BLADE SAGITTAL 25.0X1.19X90 (BLADE) ×2 IMPLANT
BLADE SAW SGTL 13X75X1.27 (BLADE) ×2 IMPLANT
BLOCK AUGMENT TIB WITH SCREW (Orthopedic Implant) ×4 IMPLANT
BNDG ESMARK 6X9 LF (GAUZE/BANDAGES/DRESSINGS) ×2
BOWL SMART MIX CTS (DISPOSABLE) ×2 IMPLANT
CEMENT HV SMART SET (Cement) ×2 IMPLANT
CLOTH BEACON ORANGE TIMEOUT ST (SAFETY) ×2 IMPLANT
CLSR STERI-STRIP ANTIMIC 1/2X4 (GAUZE/BANDAGES/DRESSINGS) ×2 IMPLANT
COMP TIB STEM REV 71 (Orthopedic Implant) ×1 IMPLANT
COMPONET TIB STEM REV 71 (Orthopedic Implant) ×1 IMPLANT
COVER BACK TABLE 24X17X13 BIG (DRAPES) ×2 IMPLANT
COVER SURGICAL LIGHT HANDLE (MISCELLANEOUS) ×2 IMPLANT
CUFF TOURNIQUET SINGLE 34IN LL (TOURNIQUET CUFF) ×2 IMPLANT
CUFF TOURNIQUET SINGLE 44IN (TOURNIQUET CUFF) IMPLANT
DCM AGC tradition primary tibial bearing bridging ×2 IMPLANT
DRAPE EXTREMITY T 121X128X90 (DRAPE) ×2 IMPLANT
DRAPE U-SHAPE 47X51 STRL (DRAPES) ×2 IMPLANT
DRSG PAD ABDOMINAL 8X10 ST (GAUZE/BANDAGES/DRESSINGS) ×2 IMPLANT
DURAPREP 26ML APPLICATOR (WOUND CARE) ×2 IMPLANT
ELECT REM PT RETURN 9FT ADLT (ELECTROSURGICAL) ×2
ELECTRODE REM PT RTRN 9FT ADLT (ELECTROSURGICAL) ×1 IMPLANT
EVACUATOR 1/8 PVC DRAIN (DRAIN) ×2 IMPLANT
FACESHIELD LNG OPTICON STERILE (SAFETY) ×2 IMPLANT
GAUZE XEROFORM 5X9 LF (GAUZE/BANDAGES/DRESSINGS) ×2 IMPLANT
GLOVE BIO SURGEON STRL SZ7.5 (GLOVE) ×4 IMPLANT
GLOVE BIOGEL PI IND STRL 6.5 (GLOVE) ×2 IMPLANT
GLOVE BIOGEL PI IND STRL 8 (GLOVE) ×2 IMPLANT
GLOVE BIOGEL PI INDICATOR 6.5 (GLOVE) ×2
GLOVE BIOGEL PI INDICATOR 8 (GLOVE) ×2
GLOVE ECLIPSE 7.5 STRL STRAW (GLOVE) ×4 IMPLANT
GOWN PREVENTION PLUS XLARGE (GOWN DISPOSABLE) ×2 IMPLANT
GOWN SRG XL XLNG 56XLVL 4 (GOWN DISPOSABLE) ×1 IMPLANT
GOWN STRL NON-REIN LRG LVL3 (GOWN DISPOSABLE) ×4 IMPLANT
GOWN STRL NON-REIN XL XLG LVL4 (GOWN DISPOSABLE) ×1
HANDPIECE INTERPULSE COAX TIP (DISPOSABLE) ×1
HOOD PEEL AWAY FACE SHEILD DIS (HOOD) ×6 IMPLANT
IMMOBILIZER KNEE 20 (SOFTGOODS)
IMMOBILIZER KNEE 20 THIGH 36 (SOFTGOODS) IMPLANT
IMMOBILIZER KNEE 22  40 CIR (ORTHOPEDIC SUPPLIES) ×1
IMMOBILIZER KNEE 22 40 CIR (ORTHOPEDIC SUPPLIES) ×1 IMPLANT
IMMOBILIZER KNEE 22 UNIV (SOFTGOODS) IMPLANT
IMMOBILIZER KNEE 24 THIGH 36 (MISCELLANEOUS) IMPLANT
IMMOBILIZER KNEE 24 UNIV (MISCELLANEOUS)
KIT BASIN OR (CUSTOM PROCEDURE TRAY) ×2 IMPLANT
KIT ROOM TURNOVER OR (KITS) ×2 IMPLANT
MANIFOLD NEPTUNE II (INSTRUMENTS) ×2 IMPLANT
NEEDLE HYPO 25GX1X1/2 BEV (NEEDLE) IMPLANT
NS IRRIG 1000ML POUR BTL (IV SOLUTION) ×2 IMPLANT
PACK TOTAL JOINT (CUSTOM PROCEDURE TRAY) ×2 IMPLANT
PAD ARMBOARD 7.5X6 YLW CONV (MISCELLANEOUS) ×4 IMPLANT
PAD CAST 4YDX4 CTTN HI CHSV (CAST SUPPLIES) ×1 IMPLANT
PADDING CAST COTTON 4X4 STRL (CAST SUPPLIES) ×1
RESTRICTOR CEMENT SZ 5 C-STEM (Cement) ×2 IMPLANT
SET HNDPC FAN SPRY TIP SCT (DISPOSABLE) ×1 IMPLANT
SPONGE GAUZE 4X4 12PLY (GAUZE/BANDAGES/DRESSINGS) ×2 IMPLANT
STAPLER VISISTAT 35W (STAPLE) ×2 IMPLANT
STRIP CLOSURE SKIN 1/2X4 (GAUZE/BANDAGES/DRESSINGS) ×2 IMPLANT
SUCTION FRAZIER TIP 10 FR DISP (SUCTIONS) ×2 IMPLANT
SUT MON AB 3-0 SH 27 (SUTURE) ×1
SUT MON AB 3-0 SH27 (SUTURE) ×1 IMPLANT
SUT VIC AB 0 CTB1 27 (SUTURE) ×4 IMPLANT
SUT VIC AB 1 CT1 27 (SUTURE) ×2
SUT VIC AB 1 CT1 27XBRD ANBCTR (SUTURE) ×2 IMPLANT
SUT VIC AB 2-0 CTB1 (SUTURE) ×4 IMPLANT
SYR CONTROL 10ML LL (SYRINGE) IMPLANT
TIBIA PLATE MAX STEM 71MM (Orthopedic Implant) ×1 IMPLANT
TOWEL OR 17X24 6PK STRL BLUE (TOWEL DISPOSABLE) ×2 IMPLANT
TOWEL OR 17X26 10 PK STRL BLUE (TOWEL DISPOSABLE) ×2 IMPLANT
TUBE ANAEROBIC SPECIMEN COL (MISCELLANEOUS) ×2 IMPLANT
WATER STERILE IRR 1000ML POUR (IV SOLUTION) ×6 IMPLANT
biomet grit blast knee stem with screw ×2 IMPLANT

## 2012-05-06 NOTE — Plan of Care (Signed)
Problem: Consults Goal: Diagnosis- Total Joint Replacement Revision Total Knee     

## 2012-05-06 NOTE — Transfer of Care (Signed)
Immediate Anesthesia Transfer of Care Note  Patient: Kristy Young  Procedure(s) Performed: Procedure(s) with comments: TOTAL KNEE REVISION (Left) - PRE OP FEMORAL NERVE BLOCK  Patient Location: PACU  Anesthesia Type:General  Level of Consciousness: oriented, sedated, patient cooperative and responds to stimulation  Airway & Oxygen Therapy: Patient Spontanous Breathing and Patient connected to nasal cannula oxygen  Post-op Assessment: Report given to PACU RN, Post -op Vital signs reviewed and stable, Patient moving all extremities and Patient moving all extremities X 4  Post vital signs: Reviewed and stable  Complications: No apparent anesthesia complications

## 2012-05-06 NOTE — Progress Notes (Signed)
ANTICOAGULATION CONSULT NOTE - Initial Consult  Pharmacy Consult for Coumadin  Indication: VTE prophylaxis  Vital Signs: Temp: 97.9 F (36.6 C) (02/28 1545) Temp src: Oral (02/28 0941) BP: 125/60 mmHg (02/28 1545) Pulse Rate: 55 (02/28 1545)  Assessment: 61yoF s/p total knee revision being started on coumadin for VTE prophylaxis (x1 month per MD note). Baseline INR 0.89, Hgb 13.0 (from pre-op labs)  Goal of Therapy:  INR 1.5-2.0  Plan:  1) Coumadin 5mg  x 1 tonight 2) Daily INR 3) F/u signs/symptoms of bleeding 4) Coumadin book / video  Benjaman Pott, PharmD, BCPS 05/06/2012   4:53 PM

## 2012-05-06 NOTE — H&P (Signed)
TOTAL KNEE REVISION ADMISSION H&P  Patient is being admitted for left revision total knee arthroplasty.  Subjective:  Chief Complaint:left knee pain.  HPI: Kristy Young, 62 y.o. female, has a history of pain and functional disability in the left knee(s) due to failed previous arthroplasty and patient has failed non-surgical conservative treatments for greater than 12 weeks to include NSAID's and/or analgesics, weight reduction as appropriate and activity modification. The indications for the revision of the total knee arthroplasty are loosening of one or more components. Onset of symptoms was gradual starting 3 years ago with gradually worsening course since that time.  Prior procedures on the left knee(s) include loosening of tibial component.  Patient currently rates pain in the left knee(s) at 7 out of 10 with activity. There is night pain, worsening of pain with activity and weight bearing and joint swelling.  Patient has evidence of loosening of tibial component by imaging studies. This condition presents safety issues increasing the risk of falls. This patient has had bone scan + for loosening.  There is no current active infection.  There are no active problems to display for this patient.  Past Medical History  Diagnosis Date  . Hematuria   . Hypertension   . Asthma     mainly with stress or colds    Past Surgical History  Procedure Laterality Date  . Abdominal hysterectomy  AGE 69  . Laparoscopic bilateral salpingo-oophectomy  2002  . Total knee arthroplasty  2005    LEFT  . Breast lumpectomy  2006    BENIGN  . Cyst removal hand      Prescriptions prior to admission  Medication Sig Dispense Refill  . albuterol (PROVENTIL) (2.5 MG/3ML) 0.083% nebulizer solution Take 2.5 mg by nebulization every 6 (six) hours as needed for wheezing or shortness of breath.      . Fluticasone-Salmeterol (ADVAIR) 250-50 MCG/DOSE AEPB Inhale 1 puff into the lungs daily as needed (for wheezing or  shortness of breath).      Marland Kitchen HYDROcodone-acetaminophen (VICODIN) 5-500 MG per tablet Take 1-2 tablets by mouth every 6 (six) hours as needed for pain.      Marland Kitchen losartan-hydrochlorothiazide (HYZAAR) 50-12.5 MG per tablet Take 1 tablet by mouth daily.      . montelukast (SINGULAIR) 10 MG tablet Take 10 mg by mouth daily.        Allergies  Allergen Reactions  . Adhesive (Tape) Other (See Comments)    IRRIATAION  . Sulfa Antibiotics Hives    History  Substance Use Topics  . Smoking status: Current Every Day Smoker -- 0.50 packs/day for 20 years    Types: Cigarettes  . Smokeless tobacco: Never Used  . Alcohol Use: Yes     Comment: RARE    No family history on file.    ROS   Objective:  Physical Exam  Vital signs in last 24 hours: Temp:  [97.1 F (36.2 C)] 97.1 F (36.2 C) (02/28 0941) Pulse Rate:  [69] 69 (02/28 0941) Resp:  [18] 18 (02/28 0941) BP: (113)/(69) 113/69 mmHg (02/28 0941) SpO2:  [97 %] 97 % (02/28 0941)  Labs:  Estimated body mass index is 28.05 kg/(m^2) as calculated from the following:   Height as of 04/29/12: 5\' 3"  (1.6 m).   Weight as of 11/25/11: 71.81 kg (158 lb 5 oz).  Imaging Review Plain radiographs demonstrate mild degenerative joint disease of the left knee(s). The overall alignment is neutral.There is evidence of loosening of the tibial components.  The bone quality appears to be fair for age and reported activity level. There is bone scan + for loosening.  Assessment/Plan:  End stage arthritis, left knee(s) with failed previous arthroplasty.   The patient history, physical examination, clinical judgment of the provider and imaging studies are consistent with end stage degenerative joint disease of the left knee(s), previous total knee arthroplasty. Revision total knee arthroplasty is deemed medically necessary. The treatment options including medical management, injection therapy, arthroscopy and revision arthroplasty were discussed at length. The  risks and benefits of revision total knee arthroplasty were presented and reviewed. The risks due to aseptic loosening, infection, stiffness, patella tracking problems, thromboembolic complications and other imponderables were discussed. The patient acknowledged the explanation, agreed to proceed with the plan and consent was signed. Patient is being admitted for inpatient treatment for surgery, pain control, PT, OT, prophylactic antibiotics, VTE prophylaxis, progressive ambulation and ADL's and discharge planning.The patient is planning to be discharged home with home health services

## 2012-05-06 NOTE — Brief Op Note (Signed)
05/06/2012  5:25 PM  PATIENT:  Kristy Young  61 y.o. female  PRE-OPERATIVE DIAGNOSIS:  LOOSE TOTAL KNEE  POST-OPERATIVE DIAGNOSIS:  LOOSE TOTAL KNEE  PROCEDURE:  Procedure(s) with comments: TOTAL KNEE REVISION (Left) - PRE OP FEMORAL NERVE BLOCK  SURGEON:  Surgeon(s) and Role:    * Harvie Junior, MD - Primary  PHYSICIAN ASSISTANT:   ASSISTANTS: bethune   ANESTHESIA:   general  EBL:  Total I/O In: 1450 [I.V.:1450] Out: 650 [Urine:550; Drains:100]  BLOOD ADMINISTERED:none  DRAINS: (1) Hemovact drain(s) in the l knee with  Suction Open   LOCAL MEDICATIONS USED:  NONE  SPECIMEN:  No Specimen  DISPOSITION OF SPECIMEN:  N/A  COUNTS:  YES  TOURNIQUET:   Total Tourniquet Time Documented: Thigh (Left) - 85 minutes Total: Thigh (Left) - 85 minutes   DICTATION: .Other Dictation: Dictation Number [id number]  PLAN OF CARE: Admit to inpatient   PATIENT DISPOSITION:  PACU - hemodynamically stable.   Delay start of Pharmacological VTE agent (>24hrs) due to surgical blood loss or risk of bleeding: no

## 2012-05-06 NOTE — Anesthesia Postprocedure Evaluation (Signed)
  Anesthesia Post-op Note  Patient: Kristy Young  Procedure(s) Performed: Procedure(s) with comments: TOTAL KNEE REVISION (Left) - PRE OP FEMORAL NERVE BLOCK  Patient Location: PACU  Anesthesia Type:GA combined with regional for post-op pain  Level of Consciousness: awake and alert   Airway and Oxygen Therapy: Patient Spontanous Breathing  Post-op Pain: mild  Post-op Assessment: Post-op Vital signs reviewed, Patient's Cardiovascular Status Stable, Respiratory Function Stable, Patent Airway, No signs of Nausea or vomiting and Pain level controlled  Post-op Vital Signs: stable  Complications: No apparent anesthesia complications

## 2012-05-06 NOTE — Progress Notes (Signed)
Orthopedic Tech Progress Note Patient Details:  Kristy Young May 16, 1950 [id number]  CPM Left Knee CPM Left Knee: On Left Knee Flexion (Degrees): 60 Left Knee Extension (Degrees): 0   Shawnie Pons 05/06/2012, 3:12 PM

## 2012-05-06 NOTE — Preoperative (Signed)
Beta Blockers   Reason not to administer Beta Blockers:Not Applicable 

## 2012-05-06 NOTE — Anesthesia Preprocedure Evaluation (Addendum)
Anesthesia Evaluation  Patient identified by MRN, date of birth, ID band Patient awake    Reviewed: Allergy & Precautions, H&P , NPO status , Patient's Chart, lab work & pertinent test results  Airway Mallampati: I TM Distance: >3 FB Neck ROM: Full    Dental  (+) Edentulous Upper, Edentulous Lower and Dental Advisory Given   Pulmonary asthma ,  breath sounds clear to auscultation        Cardiovascular hypertension, Pt. on medications Rhythm:Regular Rate:Normal     Neuro/Psych    GI/Hepatic   Endo/Other    Renal/GU      Musculoskeletal   Abdominal   Peds  Hematology   Anesthesia Other Findings   Reproductive/Obstetrics                           Anesthesia Physical Anesthesia Plan  ASA: III  Anesthesia Plan: General   Post-op Pain Management:    Induction: Intravenous  Airway Management Planned: LMA  Additional Equipment:   Intra-op Plan:   Post-operative Plan: Extubation in OR  Informed Consent: I have reviewed the patients History and Physical, chart, labs and discussed the procedure including the risks, benefits and alternatives for the proposed anesthesia with the patient or authorized representative who has indicated his/her understanding and acceptance.   Dental advisory given  Plan Discussed with: CRNA, Anesthesiologist and Surgeon  Anesthesia Plan Comments:         Anesthesia Quick Evaluation

## 2012-05-06 NOTE — Anesthesia Procedure Notes (Addendum)
Anesthesia Regional Block:  Femoral nerve block  Pre-Anesthetic Checklist: ,, timeout performed, Correct Patient, Correct Site, Correct Laterality, Correct Procedure, Correct Position, site marked, Risks and benefits discussed,  Surgical consent,  Pre-op evaluation,  At surgeon's request and post-op pain management  Laterality: Left and Lower  Prep: chloraprep       Needles:  Injection technique: Single-shot  Needle Type: Echogenic Needle     Needle Length: 9cm  Needle Gauge: 21    Additional Needles:  Procedures: ultrasound guided (picture in chart) Femoral nerve block Narrative:  Start time: 05/06/2012 10:48 AM End time: 05/06/2012 12:59 PM Injection made incrementally with aspirations every 5 mL.  Performed by: Personally  Anesthesiologist: Sheldon Silvan, MD  Additional Notes: Maraine 0.5% with EPI 1:200000  Femoral nerve block Procedure Name: Intubation Date/Time: 05/06/2012 11:39 AM Performed by: Wray Kearns A Pre-anesthesia Checklist: Patient identified, Timeout performed, Emergency Drugs available, Suction available and Patient being monitored Patient Re-evaluated:Patient Re-evaluated prior to inductionOxygen Delivery Method: Circle system utilized Preoxygenation: Pre-oxygenation with 100% oxygen Intubation Type: IV induction and Cricoid Pressure applied Ventilation: Mask ventilation without difficulty Laryngoscope Size: Mac and 4 Grade View: Grade I Tube type: Oral Tube size: 8.0 mm Number of attempts: 1 Airway Equipment and Method: Stylet Placement Confirmation: ETT inserted through vocal cords under direct vision,  breath sounds checked- equal and bilateral and positive ETCO2 Secured at: 21 cm Tube secured with: Tape Dental Injury: Teeth and Oropharynx as per pre-operative assessment

## 2012-05-07 LAB — PROTIME-INR: INR: 1.08 (ref 0.00–1.49)

## 2012-05-07 LAB — CBC
HCT: 31.1 % — ABNORMAL LOW (ref 36.0–46.0)
RDW: 14.4 % (ref 11.5–15.5)
WBC: 12.5 10*3/uL — ABNORMAL HIGH (ref 4.0–10.5)

## 2012-05-07 LAB — BASIC METABOLIC PANEL
BUN: 11 mg/dL (ref 6–23)
Chloride: 101 mEq/L (ref 96–112)
GFR calc Af Amer: 80 mL/min — ABNORMAL LOW (ref >90)
Potassium: 3.6 mEq/L (ref 3.5–5.1)

## 2012-05-07 MED ORDER — WARFARIN SODIUM 5 MG PO TABS
5.0000 mg | ORAL_TABLET | Freq: Once | ORAL | Status: AC
  Administered 2012-05-07: 5 mg via ORAL
  Filled 2012-05-07: qty 1

## 2012-05-07 MED ORDER — ALBUTEROL SULFATE (5 MG/ML) 0.5% IN NEBU
2.5000 mg | INHALATION_SOLUTION | Freq: Every day | RESPIRATORY_TRACT | Status: DC
  Administered 2012-05-07 – 2012-05-08 (×2): 2.5 mg via RESPIRATORY_TRACT
  Filled 2012-05-07 (×2): qty 0.5

## 2012-05-07 NOTE — Progress Notes (Signed)
Physical Therapy Treatment Patient Details Name: Kristy Young MRN: [id number] DOB: [birth year] Today's Date: 05/07/2012 Time: 4098-1191 PT Time Calculation (min): 43 min  PT Assessment / Plan / Recommendation Comments on Treatment Session  Pt doing well with mobility.  Nausea limiting pt participation in PT.      Follow Up Recommendations  Home health PT;Supervision - Intermittent     Does the patient have the potential to tolerate intense rehabilitation     Barriers to Discharge        Equipment Recommendations  Rolling walker with 5" wheels;Other (comment)    Recommendations for Other Services    Frequency 7X/week   Plan Discharge plan remains appropriate;Frequency remains appropriate    Precautions / Restrictions Precautions Precautions: Knee Restrictions Weight Bearing Restrictions: Yes LLE Weight Bearing: Weight bearing as tolerated   Pertinent Vitals/Pain 6/10 pain in knee.  Pt medicated prior to session.  Nauseated by pain medication.     Mobility  Bed Mobility Bed Mobility: Supine to Sit;Sit to Supine Supine to Sit: 5: Supervision Sit to Supine: 5: Supervision Details for Bed Mobility Assistance: Pt able to manage LLE independently.  Transfers Transfers: Sit to Stand;Stand to Sit Sit to Stand: 5: Supervision;From bed;From chair/3-in-1;With upper extremity assist Stand to Sit: 5: Supervision;To bed;To chair/3-in-1 Details for Transfer Assistance: Cues for hand placement Ambulation/Gait Ambulation/Gait Assistance: 5: Supervision Ambulation Distance (Feet): 25 Feet Assistive device: Rolling walker Ambulation/Gait Assistance Details: Cues to decrease distance from walker Gait Pattern: Step-to pattern    Exercises     PT Diagnosis:    PT Problem List:   PT Treatment Interventions:     PT Goals Acute Rehab PT Goals PT Goal Formulation: With patient Time For Goal Achievement: 05/21/12 Potential to Achieve Goals: Good Pt will go Supine/Side to Sit: with  modified independence PT Goal: Supine/Side to Sit - Progress: Progressing toward goal Pt will go Sit to Supine/Side: with modified independence PT Goal: Sit to Supine/Side - Progress: Progressing toward goal Pt will go Sit to Stand: with supervision PT Goal: Sit to Stand - Progress: Met Pt will Ambulate: >150 feet;with supervision PT Goal: Ambulate - Progress: Progressing toward goal  Visit Information  Last PT Received On: 05/07/12 Assistance Needed: +1    Subjective Data  Subjective: I still feel sick Patient Stated Goal: Walk without pain.    Cognition  Cognition Overall Cognitive Status: Appears within functional limits for tasks assessed/performed Arousal/Alertness: Awake/alert Orientation Level: Oriented X4 / Intact Behavior During Session: WFL for tasks performed    Balance     End of Session PT - End of Session Equipment Utilized During Treatment: Gait belt;Right knee immobilizer Activity Tolerance: Other (comment) (Limited by nausea. ) Patient left: in bed;with call bell/phone within reach;with family/visitor present Nurse Communication: Mobility status   GP     Canuto Kingston 05/07/2012, 6:50 PM

## 2012-05-07 NOTE — Evaluation (Signed)
Physical Therapy Evaluation Patient Details Name: Kristy Young MRN: [id number] DOB: December 05, 1950 Today's Date: 05/07/2012 Time: 0920-0100 PT Time Calculation (min): 940 min  PT Assessment / Plan / Recommendation Clinical Impression  Pt isa 62 y/o female s/p L TKA. Pt doing well with mobility.  Acute PT to follow pt to improve functional mobility for d/c to home.      PT Assessment  Patient needs continued PT services    Follow Up Recommendations  Home health PT;Supervision - Intermittent    Does the patient have the potential to tolerate intense rehabilitation      Barriers to Discharge None      Equipment Recommendations  Rolling walker with 5" wheels;Other (comment) (3 in 1)    Recommendations for Other Services     Frequency 7X/week    Precautions / Restrictions Precautions Precautions: Knee Restrictions Weight Bearing Restrictions: Yes LLE Weight Bearing: Weight bearing as tolerated   Pertinent Vitals/Pain Pt c/o 4/10 pain.        Mobility  Bed Mobility Bed Mobility: Supine to Sit;Sit to Supine Supine to Sit: 4: Min assist;HOB flat Sit to Supine: 4: Min assist;HOB flat Details for Bed Mobility Assistance: assist for LLE Transfers Transfers: Sit to Stand;Stand to Sit Sit to Stand: 4: Min guard;From bed;With upper extremity assist Stand to Sit: 4: Min guard;To chair/3-in-1;With upper extremity assist Details for Transfer Assistance: Cues for hand placement Ambulation/Gait Ambulation/Gait Assistance: 4: Min guard Ambulation Distance (Feet): 30 Feet Assistive device: Rolling walker Ambulation/Gait Assistance Details: Cues for gait sequencing.  Gait Pattern: Step-to pattern Stairs: No Wheelchair Mobility Wheelchair Mobility: No    Exercises Total Joint Exercises Ankle Circles/Pumps: 10 reps;AROM;Both Quad Sets: 5 reps;AROM;Left   PT Diagnosis: Difficulty walking;Acute pain;Generalized weakness  PT Problem List: Decreased range of motion;Decreased  strength;Decreased balance;Decreased mobility;Pain;Decreased knowledge of use of DME PT Treatment Interventions: Stair training;Gait training;DME instruction;Therapeutic activities;Functional mobility training;Therapeutic exercise;Wheelchair mobility training;Patient/family education   PT Goals Acute Rehab PT Goals PT Goal Formulation: With patient Time For Goal Achievement: 05/21/12 Potential to Achieve Goals: Good Pt will go Supine/Side to Sit: with modified independence PT Goal: Supine/Side to Sit - Progress: Goal set today Pt will go Sit to Supine/Side: with modified independence PT Goal: Sit to Supine/Side - Progress: Goal set today Pt will go Sit to Stand: with supervision PT Goal: Sit to Stand - Progress: Progressing toward goal Pt will Ambulate: >150 feet;with supervision PT Goal: Ambulate - Progress: Goal set today  Visit Information  Last PT Received On: 05/07/12    Subjective Data  Subjective: agree to PT evall Patient Stated Goal: Walk without pain.    Prior Functioning  Home Living Lives With: Spouse;Family Available Help at Discharge: Family;Available 24 hours/day Type of Home: House Home Access: Ramped entrance Home Layout: One level Bathroom Shower/Tub: Walk-in shower;Tub only;Door [employer] Toilet: Pharmacist, community: Yes Home Adaptive Equipment: None Prior Function Level of Independence: Independent Able to Take Stairs?: Yes Driving: Yes Vocation: Full time employment Communication Communication: No difficulties Dominant Hand: Left    Cognition  Cognition Overall Cognitive Status: Appears within functional limits for tasks assessed/performed Arousal/Alertness: Awake/alert Orientation Level: Oriented X4 / Intact Behavior During Session: WFL for tasks performed    Extremity/Trunk Assessment Right Upper Extremity Assessment RUE ROM/Strength/Tone: Within functional levels Left Upper Extremity Assessment LUE ROM/Strength/Tone: Within  functional levels Right Lower Extremity Assessment RLE ROM/Strength/Tone: Within functional levels Left Lower Extremity Assessment LLE ROM/Strength/Tone: Unable to fully assess   Balance    End of  Session PT - End of Session Equipment Utilized During Treatment: Gait belt;Right knee immobilizer Activity Tolerance: Patient tolerated treatment well Patient left: in bed;with call bell/phone within reach;with family/visitor present Nurse Communication: Mobility status CPM Left Knee CPM Left Knee: Off  GP     Carlosdaniel Grob 05/07/2012, 1:36 PM Mylene Bow L. Enrika Aguado DPT [phone]

## 2012-05-07 NOTE — Progress Notes (Signed)
Patient ID: Kristy Young, female   DOB: 29-Oct-1950, 62 y.o.   MRN: [id number] PATIENT ID: Kristy Young  MRN: [id number]  DOB/AGE:  1951-01-15 / 62 y.o.  1 Day Post-Op Procedure(s) (LRB): TOTAL KNEE REVISION (Left)    PROGRESS NOTE Subjective: Patient is alert, oriented, no Nausea, no Vomiting, yes passing gas, no Bowel Movement. Taking PO well. Denies SOB, Chest or Calf Pain. Using Incentive Spirometer, PAS in place. Ambulate bed to chair, will be WBAT, CPM 0-60 Patient reports pain as 4 on 0-10 scale  .    Objective: Vital signs in last 24 hours: Filed Vitals:   05/06/12 1557 05/06/12 2036 05/06/12 2225 05/07/12 0701  BP:   103/49 105/52  Pulse:   76 72  Temp:   97.8 F (36.6 C) 97.6 F (36.4 C)  TempSrc:      Resp:   18 18  SpO2: 100% 95% 96% 99%      Intake/Output from previous day: I/O last 3 completed shifts: In: 2890 [P.O.:240; I.V.:2650] Out: 650 [Urine:550; Drains:100]   Intake/Output this shift: Total I/O In: -  Out: 900 [Urine:600; Drains:300]   LABORATORY DATA:  Recent Labs  05/07/12 0650  WBC 12.5*  HGB 10.6*  HCT 31.1*  PLT 170  NA 138  K 3.6  CL 101  CO2 25  BUN 11  CREATININE 0.88  GLUCOSE 108*  INR 1.08  CALCIUM 9.6    Examination: Neurologically intact ABD soft Neurovascular intact Sensation intact distally Intact pulses distally Dorsiflexion/Plantar flexion intact Incision: scant drainage No cellulitis present Compartment soft} Blood and plasma separated in drain indicating minimal recent drainage, drain pulled without difficulty.  Assessment:   1 Day Post-Op Procedure(s) (LRB): TOTAL KNEE REVISION (Left) ADDITIONAL DIAGNOSIS:    Plan: PT/OT WBAT, CPM 5/hrs day until ROM 0-90 degrees, then D/C CPM DVT Prophylaxis:  SCDx72hrs, Coumadin x 2 weeks DISCHARGE PLAN: Home DISCHARGE NEEDS: HHPT, HHRN, CPM, Walker and 3-in-1 comode seat     Kristy Young J 05/07/2012, 8:25 AM

## 2012-05-07 NOTE — Op Note (Signed)
Kristy Young, Kristy Young                   ACCOUNT NO.:  [id number]  MEDICAL RECORD NO.:  [id number]  LOCATION:  [id number]                        FACILITY:  [facility]  PHYSICIAN:  Harvie Junior, M.D.   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  PROCEDURE: 1. Left total knee revision with AGC size 71 tibial component with two     6-mm augments and a 14-mm polyethylene insert. 2. Aggressive synovectomy of the left knee.  SURGEON:  Harvie Junior, M.D.  ASSISTANT:  Marshia Ly, PA.  ANESTHESIA:  General.  BRIEF HISTORY:  Kristy Young is a 62 year old female with a long history of having had a total knee replacement in [place], [place] in 2006.  She presented to me about 6 months ago complaining of ongoing left knee pain.  She had had pretty good relief for the first 3-4 years where her knee replacement would have been kind of insidious over the last couple of years, and she is concerned about her x-rays to me looked as if it had a lucency under the cement mantle.  We got a bone scan, which showed loosening or certainly concern for loosening.  We aspirated her for no growth and did a workup blood studies, which showed no evidence of infection.  We talked about treatment options but felt that tibial component revision was appropriate and after a lot of reading of her old op notes and getting in touch with the representatives here in town, we were able to locate the appropriate components for tibial revision and a sense where we did need to revise the femur, which is what I suspected, and she was brought to the operating room for this procedure.  DESCRIPTION OF  PROCEDURE:  The patient was brought to the operating room and after adequate anesthesia was obtained with general anesthetic, the patient was placed supine on the operating table.  The leg was then prepped and draped in the usual sterile fashion.  Following this, the leg was exsanguinated.  Blood pressure tourniquet inflated to 300 mmHg. Following this, a curved incision was made in the area of her old incision, subcutaneous tissue down the level of the extensor mechanism. Flaps were raised.  Medial parapatellar arthrotomy was undertaken inline with the old arthrotomy, and at this point, the synovectomy was performed.  Aggressive medial lateral synovectomy, and at this point, we tried to expose the tibial, went around the medial side and kind got the tibia rotated out.  Tibial component was able to be lifted out at this point.  Once this was done, we started moving some of the cement mantle. Once we got some of the cement mantle off, we put an intramedullary guide and then did a clean-up cut, and then were able to size the tibia to that point.  It was sized to a 71 down from a 75 from  before.  I got an excellent fit with the dead midline.  At that point, we could cemented an 80-mm stem and this was our plan.  I put the trial in and trialing up poly got to a 20 poly, and my concern was that it had no poly bigger than that, and all I thought 20 was as big as we need ago fell.  I felt like we probably should put some augments.  We took a 19 and had a two 6-mm augments, and trialed that and it felt great.  At this point, we cemented it, and this at this point, trial components were removed.  The knee was copiously and thoroughly lavaged and suctioned dry.  We trialed the 71 after drilling for the squared peg and then the depth rounded extension fit nicely and after irrigation and drying, we cemented in a size 71 AGC femur with two 6 mm augments and once that cement was allowed to harden, we  trialed some trial polys and 14 was the appropriate size, 16 was too tight and 14 gave [place] excellent extension and excellent stability in mid-flexion and 90 degrees of flexion.  At this point, medium Hemovac drain was placed.  The medial parapatellar arthrotomy was closed with a 1 Vicryl running.  The skin was closed with staples.  After 0 and 2-0 Vicryl in the deeper layers. Once this was done, the tourniquet had been let down after the trials were in placed.  All bleeders were controlled with electrocautery and after closure, the patient was placed into a knee immobilizer and taken to recovery and was noted to in satisfactory condition.  Estimated blood loss of the procedure was minimal.     Harvie Junior, M.D.     Ranae Plumber  D:  05/06/2012  T:  05/07/2012  Job:  [id number]

## 2012-05-07 NOTE — Progress Notes (Signed)
ANTICOAGULATION CONSULT NOTE - Follow Up Consult  Pharmacy Consult for Coumadin Indication: VTE prophylaxis  Allergies  Allergen Reactions  . Adhesive (Tape) Other (See Comments)    IRRIATAION  . Sulfa Antibiotics Hives    Vital Signs: Temp: 97.6 F (36.4 C) (03/01 0701) BP: 105/52 mmHg (03/01 0701) Pulse Rate: 72 (03/01 0701)  Labs:  Recent Labs  05/07/12 0650  HGB 10.6*  HCT 31.1*  PLT 170  LABPROT 13.9  INR 1.08  CREATININE 0.88    The CrCl is unknown because both a height and weight (above a minimum accepted value) are required for this calculation.  Assessment: Kristy Young is a 61yoF s/p total knee revision being on coumadin for VTE prophylaxis (x1 month per MD note). Baseline INR 1.08. CBC indicative of post-op anemia. No bleeding noted.   Goal of Therapy:  INR 1.5-2 Monitor platelets by anticoagulation protocol: Yes   Plan:  Repeat coumadin 5mg  po x 1 dose today  F/U PT/INR tomorrow morning  Thank you,  Brett Fairy, PharmD, BCPS 05/07/2012 10:38 AM

## 2012-05-08 LAB — CBC
MCH: 29.2 pg (ref 26.0–34.0)
Platelets: 147 10*3/uL — ABNORMAL LOW (ref 150–400)
RBC: 3.29 MIL/uL — ABNORMAL LOW (ref 3.87–5.11)
WBC: 13.2 10*3/uL — ABNORMAL HIGH (ref 4.0–10.5)

## 2012-05-08 LAB — PROTIME-INR
INR: 1.93 — ABNORMAL HIGH (ref 0.00–1.49)
Prothrombin Time: 21.3 seconds — ABNORMAL HIGH (ref 11.6–15.2)

## 2012-05-08 NOTE — Progress Notes (Addendum)
ANTICOAGULATION CONSULT NOTE - Follow Up Consult  Pharmacy Consult for Coumadin Indication: VTE prophylaxis  Allergies  Allergen Reactions  . Adhesive (Tape) Other (See Comments)    IRRIATAION  . Sulfa Antibiotics Hives    Vital Signs: Temp: 100.3 F (37.9 C) (03/02 0652) BP: 100/47 mmHg (03/02 0652) Pulse Rate: 93 (03/02 0652)  Labs:  Recent Labs  05/07/12 0650 05/08/12 0651  HGB 10.6* 9.6*  HCT 31.1* 28.0*  PLT 170 147*  LABPROT 13.9 21.3*  INR 1.08 1.93*  CREATININE 0.88  --     The CrCl is unknown because both a height and weight (above a minimum accepted value) are required for this calculation.  Assessment: Kristy Young is a 61yoF s/p total knee revision being on coumadin for VTE prophylaxis (x1 month per MD note). Baseline INR up to 1.93 after two doses of coumadin (goal 1.5-2). CBC indicative of post-op anemia. No bleeding noted.   Goal of Therapy:  INR 1.5-2 Monitor platelets by anticoagulation protocol: Yes   Plan:  No coumadin today, if patient is d/c on Monday, she is likely a 2.5mg  candidate F/U PT/INR tomorrow morning  Thank you,  Brett Fairy, PharmD, BCPS 05/08/2012 10:20 AM

## 2012-05-08 NOTE — Progress Notes (Signed)
Physical Therapy Treatment Patient Details Name: Kristy Young MRN: [id number] DOB: 12-25-1950 Today's Date: 05/08/2012 Time:  -     PT Assessment / Plan / Recommendation Comments on Treatment Session  Pt making steady progress with mobility at this date.      Follow Up Recommendations  Home health PT;Supervision - Intermittent     Does the patient have the potential to tolerate intense rehabilitation     Barriers to Discharge        Equipment Recommendations  Rolling walker with 5" wheels;Other (comment)    Recommendations for Other Services    Frequency 7X/week   Plan Discharge plan remains appropriate;Frequency remains appropriate    Precautions / Restrictions Precautions Precautions: Knee Restrictions LLE Weight Bearing: Weight bearing as tolerated       Mobility  Bed Mobility Bed Mobility: Supine to Sit;Sitting - Scoot to Edge of Bed Supine to Sit: 6: Modified independent (Device/Increase time);HOB flat Sitting - Scoot to Edge of Bed: 6: Modified independent (Device/Increase time) Details for Bed Mobility Assistance: Pt manages LLE with her UE's.   Transfers Transfers: Sit to Stand;Stand to Sit Sit to Stand: 5: Supervision;With upper extremity assist;From bed Stand to Sit: 5: Supervision;With upper extremity assist;With armrests;To chair/3-in-1 Ambulation/Gait Ambulation/Gait Assistance: 5: Supervision Ambulation Distance (Feet): 80 Feet Assistive device: Rolling walker Ambulation/Gait Assistance Details: Cues for body positioning inside RW, tall posture, sequencing, & increase WBing through LLE  Gait Pattern: Step-to pattern;Decreased stance time - left;Decreased hip/knee flexion - left;Decreased step length - right;Decreased step length - left Gait velocity: decreased Stairs: No Wheelchair Mobility Wheelchair Mobility: No      PT Goals Acute Rehab PT Goals Time For Goal Achievement: 05/21/12 Potential to Achieve Goals: Good Pt will go Supine/Side to Sit:  with modified independence PT Goal: Supine/Side to Sit - Progress: Met Pt will go Sit to Supine/Side: with modified independence Pt will go Sit to Stand: with supervision PT Goal: Sit to Stand - Progress: Met Pt will Ambulate: >150 feet;with supervision PT Goal: Ambulate - Progress: Progressing toward goal  Visit Information  Last PT Received On: 05/08/12 Assistance Needed: +1    Subjective Data  Patient Stated Goal: Walk without pain.    Cognition  Cognition Overall Cognitive Status: Appears within functional limits for tasks assessed/performed Arousal/Alertness: Awake/alert Orientation Level: Oriented X4 / Intact Behavior During Session: WFL for tasks performed    Balance     End of Session PT - End of Session Equipment Utilized During Treatment: Gait belt;Right knee immobilizer Activity Tolerance: Patient tolerated treatment well Patient left: in chair;with call bell/phone within reach;with family/visitor present Nurse Communication: Mobility status     Verdell Face, [place] [phone] 05/08/2012

## 2012-05-08 NOTE — Progress Notes (Signed)
PATIENT ID: Kristy Young  MRN: [id number]  DOB/AGE:  January 10, 1951 / 62 y.o.  2 Days Post-Op Procedure(s) (LRB): TOTAL KNEE REVISION (Left)    PROGRESS NOTE Subjective: Patient is alert, oriented, no Nausea, no Vomiting, yes passing gas, no Bowel Movement. Taking PO well. Denies SOB, Chest or Calf Pain. Does not have an Incentive Spirometer, PAS in place. Ambulating well with PT. Patient reports pain as moderate  .    Objective: Vital signs in last 24 hours: Filed Vitals:   05/07/12 2027 05/07/12 2312 05/08/12 0242 05/08/12 0652  BP:  109/50 101/49 100/47  Pulse:  61 102 93  Temp:  100.1 F (37.8 C) 99.6 F (37.6 C) 100.3 F (37.9 C)  TempSrc:      Resp:  18 18 18   SpO2: 99% 93% 92% 92%      Intake/Output from previous day: I/O last 3 completed shifts: In: 1680 [P.O.:480; I.V.:1200] Out: 900 [Urine:600; Drains:300]   Intake/Output this shift:     LABORATORY DATA:  Recent Labs  05/07/12 0650 05/08/12 0651  WBC 12.5* 13.2*  HGB 10.6* 9.6*  HCT 31.1* 28.0*  PLT 170 147*  NA 138  --   K 3.6  --   CL 101  --   CO2 25  --   BUN 11  --   CREATININE 0.88  --   GLUCOSE 108*  --   INR 1.08 1.93*  CALCIUM 9.6  --     Examination: Neurologically intact ABD soft Neurovascular intact Sensation intact distally Intact pulses distally Dorsiflexion/Plantar flexion intact Incision: dressing C/D/I}  Assessment:   2 Days Post-Op Procedure(s) (LRB): TOTAL KNEE REVISION (Left) ADDITIONAL DIAGNOSIS:  none  Plan: PT/OT WBAT DVT Prophylaxis:  [id number], Coumadin Will get incentive spirometer to patient's room today. DISCHARGE PLAN: Home Monday DISCHARGE NEEDS: HHPT, HHRN, Walker and 3-in-1 comode seat     WILLIAMS,JENNIFER M. 05/08/2012, 9:44 AM

## 2012-05-09 ENCOUNTER — Encounter (HOSPITAL_COMMUNITY): Payer: Self-pay | Admitting: Orthopedic Surgery

## 2012-05-09 LAB — PROTIME-INR: Prothrombin Time: 18.4 seconds — ABNORMAL HIGH (ref 11.6–15.2)

## 2012-05-09 LAB — CBC
MCH: 29.2 pg (ref 26.0–34.0)
Platelets: 147 10*3/uL — ABNORMAL LOW (ref 150–400)
RBC: 3.36 MIL/uL — ABNORMAL LOW (ref 3.87–5.11)
WBC: 12.4 10*3/uL — ABNORMAL HIGH (ref 4.0–10.5)

## 2012-05-09 LAB — BODY FLUID CULTURE: Culture: NO GROWTH

## 2012-05-09 MED ORDER — WARFARIN SODIUM 2.5 MG PO TABS: ORAL_TABLET | ORAL | Status: DC

## 2012-05-09 MED ORDER — METHOCARBAMOL 500 MG PO TABS: 500.0000 mg | ORAL_TABLET | Freq: Four times a day (QID) | ORAL | Status: DC | PRN

## 2012-05-09 NOTE — Progress Notes (Signed)
Physical Therapy Treatment Patient Details Name: Kristy Young MRN: [id number] DOB: Dec 19, 1950 Today's Date: 05/09/2012 Time: 4098-1191 PT Time Calculation (min): 16 min  PT Assessment / Plan / Recommendation Comments on Treatment Session  Pt presents with L TKA revision. Is moving well today. Pt is ready for d/c from acute standpoint at this time to home with supervsion and HHPT.     Follow Up Recommendations  Home health PT;Supervision - Intermittent     Does the patient have the potential to tolerate intense rehabilitation     Barriers to Discharge        Equipment Recommendations  Rolling walker with 5" wheels (3-in-1)    Recommendations for Other Services    Frequency 7X/week   Plan Discharge plan remains appropriate;Frequency remains appropriate    Precautions / Restrictions Precautions Precautions: Knee Required Braces or Orthoses: Knee Immobilizer - Left Knee Immobilizer - Left: Discontinue once straight leg raise with < 10 degree lag Restrictions Weight Bearing Restrictions: Yes LLE Weight Bearing: Weight bearing as tolerated   Pertinent Vitals/Pain No pain.     Mobility  Bed Mobility Bed Mobility: Supine to Sit;Sitting - Scoot to Edge of Bed Supine to Sit: 6: Modified independent (Device/Increase time) Sitting - Scoot to Edge of Bed: 6: Modified independent (Device/Increase time) Details for Bed Mobility Assistance: Pt able to assist LLE to EOB with UE's.  Transfers Transfers: Sit to Stand;Stand to Sit Sit to Stand: 5: Supervision;From bed;With upper extremity assist Stand to Sit: 6: Modified independent (Device/Increase time);With upper extremity assist;To bed Details for Transfer Assistance: Cues for hand placement Ambulation/Gait Ambulation/Gait Assistance: 5: Supervision Ambulation Distance (Feet): 200 Feet Assistive device: Rolling walker Ambulation/Gait Assistance Details: Cues for upright posture and to look forward with ambulation.  Gait Pattern:  Step-to pattern;Decreased stance time - left;Decreased hip/knee flexion - left;Decreased step length - right;Decreased step length - left Gait velocity: decreased Stairs: No Wheelchair Mobility Wheelchair Mobility: No    Exercises     PT Diagnosis:    PT Problem List:   PT Treatment Interventions:     PT Goals Acute Rehab PT Goals PT Goal Formulation: With patient Time For Goal Achievement: 05/21/12 Potential to Achieve Goals: Good Pt will go Supine/Side to Sit: with modified independence PT Goal: Supine/Side to Sit - Progress: Met Pt will go Sit to Supine/Side: with modified independence PT Goal: Sit to Supine/Side - Progress: Met Pt will go Sit to Stand: with supervision PT Goal: Sit to Stand - Progress: Met PT Goal: Ambulate - Progress: Progressing toward goal  Visit Information  Last PT Received On: 05/09/12 Assistance Needed: +1    Subjective Data  Subjective: "I am ready to go home." Patient Stated Goal: return home.   Cognition  Cognition Overall Cognitive Status: Appears within functional limits for tasks assessed/performed Arousal/Alertness: Awake/alert Orientation Level: Oriented X4 / Intact Behavior During Session: WFL for tasks performed    Balance  Balance Balance Assessed: Yes Dynamic Standing Balance Dynamic Standing - Level of Assistance: 5: Stand by assistance Dynamic Standing - Balance Activities: Other (comment) (able to tie shoes in standing without assistance or support.)  End of Session PT - End of Session Equipment Utilized During Treatment: Gait belt;Left knee immobilizer Activity Tolerance: Patient tolerated treatment well Patient left: in bed;with call bell/phone within reach;with family/visitor present (sitting EOB.) Nurse Communication: Mobility status   GP    [place], [place], PT [phone] Sunny Schlein, [place] [phone] 05/09/2012, 10:13 AM

## 2012-05-09 NOTE — Discharge Summary (Signed)
Patient ID: Kristy Young MRN: [id number] DOB/AGE: [birth year] 62 y.o.  Admit date: 05/06/2012 Discharge date: 05/09/2012  Admission Diagnoses:  Principal Problem:   Failed total knee, left   Discharge Diagnoses:  Same  Past Medical History  Diagnosis Date  . Hematuria   . Hypertension   . Asthma     mainly with stress or colds    Surgeries: Procedure(s):Left TOTAL KNEE REVISION on 05/06/2012  Discharged Condition: Improved  Hospital Course: Kristy Young is an 62 y.o. female who was admitted 05/06/2012 for operative treatment ofFailed total knee, left. Patient has severe unremitting pain that affects sleep, daily activities, and work/hobbies. After pre-op clearance the patient was taken to the operating room on 05/06/2012 and underwent  Procedure(s):Left TOTAL KNEE REVISION.    Patient was given perioperative antibiotics: Anti-infectives   Start     Dose/Rate Route Frequency Ordered Stop   05/06/12 1800  ceFAZolin (ANCEF) IVPB 2 g/50 mL premix     2 g 100 mL/hr over 30 Minutes Intravenous Every 6 hours 05/06/12 1549 05/07/12 0236   05/06/12 1310  cefUROXime (ZINACEF) injection  Status:  Discontinued       As needed 05/06/12 1310 05/06/12 1409   05/06/12 0600  ceFAZolin (ANCEF) IVPB 2 g/50 mL premix     2 g 100 mL/hr over 30 Minutes Intravenous On call to O.R. 05/05/12 1424 05/06/12 1204       Patient was given sequential compression devices, early ambulation, and chemoprophylaxis to prevent DVT.  Patient benefited maximally from hospital stay and there were no complications.    Recent vital signs: Patient Vitals for the past 24 hrs:  BP Temp Temp src Pulse Resp SpO2  05/09/12 0712 97/46 mmHg 98.8 F (37.1 C) Oral 88 18 93 %  05/08/12 2052 102/50 mmHg 99.7 F (37.6 C) Oral 98 18 93 %  05/08/12 1949 - - - - - 95 %  05/08/12 1423 115/51 mmHg 99.1 F (37.3 C) Oral 85 18 93 %     Recent laboratory studies:  Recent Labs  05/07/12 0650 05/08/12 0651 05/09/12 0526  WBC  12.5* 13.2* 12.4*  HGB 10.6* 9.6* 9.8*  HCT 31.1* 28.0* 28.6*  PLT 170 147* 147*  NA 138  --   --   K 3.6  --   --   CL 101  --   --   CO2 25  --   --   BUN 11  --   --   CREATININE 0.88  --   --   GLUCOSE 108*  --   --   INR 1.08 1.93* 1.58*  CALCIUM 9.6  --   --      Discharge Medications:     Medication List    STOP taking these medications       HYDROcodone-acetaminophen 5-500 MG per tablet  Commonly known as:  VICODIN      TAKE these medications       albuterol (2.5 MG/3ML) 0.083% nebulizer solution  Commonly known as:  PROVENTIL  Take 2.5 mg by nebulization every 6 (six) hours as needed for wheezing or shortness of breath.     Fluticasone-Salmeterol 250-50 MCG/DOSE Aepb  Commonly known as:  ADVAIR  Inhale 1 puff into the lungs daily as needed (for wheezing or shortness of breath).     losartan-hydrochlorothiazide 50-12.5 MG per tablet  Commonly known as:  HYZAAR  Take 1 tablet by mouth daily.     methocarbamol 500 MG tablet  Commonly  known as:  ROBAXIN  Take 1 tablet (500 mg total) by mouth every 6 (six) hours as needed (spasm.).     montelukast 10 MG tablet  Commonly known as:  SINGULAIR  Take 10 mg by mouth daily.     oxyCODONE-acetaminophen 5-325 MG per tablet  Commonly known as:  PERCOCET/ROXICET  Take 1-2 tablets by mouth every 6 (six) hours as needed for pain.     warfarin 2.5 MG tablet  Commonly known as:  COUMADIN  Take one daily unless otherwise directed. Shoot for INR of 1.5-2.0. Treat x 1 month.        Diagnostic Studies: Dg Chest 2 View  04/29/2012  *RADIOLOGY REPORT*  Clinical Data: Preoperative evaluation for total knee revision. Hypertension.  10-year pack history of smoking  CHEST - 2 VIEW  Comparison: None.  Findings: Heart and mediastinal contours are within normal limits. Calcification in the aortic arch is noted.  The lung fields demonstrate some prominence of the interstitial markings most notable at the lung bases and in the  right middle lobe and compatible with underlying bronchitic change/scarring.  No focal infiltrates or signs of congestive failure are seen.  No pleural fluid is noted.  Bony structures appear intact.  IMPRESSION: Underlying bronchitic change correlating with the history of smoking.  No worrisome focal or acute abnormalities identified   Original Report Authenticated By: Rhodia Albright, M.D.     Disposition: 01-Home or Self Care      Discharge Orders   Future Orders Complete By Expires     CPM  As directed     Comments:      Continuous passive motion machine (CPM):      Use the CPM from 0 degrees to 60 degrees  for 6-8 hours per day.      You may increase by 5-10 degrees per day.  You may break it up into 2 or 3 sessions per day.      Use CPM for 1-2 weeks or until you are told to stop.    Call MD / Call 911  As directed     Comments:      If you experience chest pain or shortness of breath, CALL 911 and be transported to the hospital emergency room.  If you develope a fever above 101 F, pus (white drainage) or increased drainage or redness at the wound, or calf pain, call your surgeon's office.    Constipation Prevention  As directed     Comments:      Drink plenty of fluids.  Prune juice may be helpful.  You may use a stool softener, such as Colace (over the counter) 100 mg twice a day.  Use MiraLax (over the counter) for constipation as needed.    Diet general  As directed     Discharge instructions  As directed     Comments:      Keep your knee incision dry x 2 1/2 weeks.    Increase activity slowly as tolerated  As directed     Weight bearing as tolerated  As directed        Follow-up Information   Follow up with GRAVES,JOHN L, MD. Schedule an appointment as soon as possible for a visit in 2 weeks.   Contact information:   [address] [place] [address] [phone]        Signed: Matthew Folks 05/09/2012, 12:52 PM

## 2012-05-09 NOTE — Progress Notes (Signed)
Subjective: 3 Days Post-Op Procedure(s) (LRB): TOTAL KNEE REVISION (Left) Patient reports pain as 4 on 0-10 scale.  Taking po/voiding ok. Progressing with PT.   Objective: Vital signs in last 24 hours: Temp:  [98.8 F (37.1 C)-99.7 F (37.6 C)] 98.8 F (37.1 C) (03/03 0712) Pulse Rate:  [85-98] 88 (03/03 0712) Resp:  [18] 18 (03/03 0712) BP: (97-115)/(46-51) 97/46 mmHg (03/03 0712) SpO2:  [93 %-95 %] 93 % (03/03 0712)  Intake/Output from previous day: 03/02 0701 - 03/03 0700 In: 720 [P.O.:720] Out: -  Intake/Output this shift:     Recent Labs  05/07/12 0650 05/08/12 0651 05/09/12 0526  HGB 10.6* 9.6* 9.8*    Recent Labs  05/08/12 0651 05/09/12 0526  WBC 13.2* 12.4*  RBC 3.29* 3.36*  HCT 28.0* 28.6*  PLT 147* 147*    Recent Labs  05/07/12 0650  NA 138  K 3.6  CL 101  CO2 25  BUN 11  CREATININE 0.88  GLUCOSE 108*  CALCIUM 9.6    Recent Labs  05/08/12 0651 05/09/12 0526  INR 1.93* 1.58*  Left knee:  Neurovascular intact Sensation intact distally Intact pulses distally Dorsiflexion/Plantar flexion intact Incision: dressing C/D/I Compartment soft  Assessment/Plan: 3 Days Post-Op Procedure(s) (LRB): TOTAL KNEE REVISION (Left) Plan: Discharge home with home health  BETHUNE,JAMES G 05/09/2012, 8:16 AM

## 2012-05-09 NOTE — Progress Notes (Signed)
CARE MANAGEMENT NOTE 05/09/2012  Patient:  Kristy Young, Kristy Young   Account Number:  [id number]  Date Initiated:  05/09/2012  Documentation initiated by:  Vance Peper  Subjective/Objective Assessment:   62 yr old female s/p left total knee arthroplasty.     Action/Plan:   CM spoke with patient and husband concerning home health and DME needs. Choice offered. Patient lives in [place], [place]. Faxed ordsers and demographics to [facility] [phone] Start of care is 05/11/12   Anticipated DC Date:  05/09/2012   Anticipated DC Plan:  HOME W HOME HEALTH SERVICES      DC Planning Services  CM consult      [facility] Choice  DURABLE MEDICAL EQUIPMENT  HOME HEALTH   Choice offered to / List presented to:  C-1 Patient   DME arranged  3-N-1  [facility]  CPM      DME agency  [employer]     HH arranged  HH-1 RN  HH-2 PT      HH agency  OTHER - SEE NOTE   Status of service:  Completed, signed off Medicare Important Message given?   (If response is "NO", the following Medicare IM given date fields will be blank) Date Medicare IM given:   Date Additional Medicare IM given:    Discharge Disposition:  HOME W HOME HEALTH SERVICES  Per UR Regulation:    If discussed at Long Length of Stay Meetings, dates discussed:    Comments:  05/09/12 11:03 AM Vance Peper, RN BSN Case Manager Patient will receive home health nursing and physical therapy through [facility]. [phone] Start of care per Toniann Fail will be 05/11/12. CM explained this to patient and husband.CPM to be delivered to her home today.

## 2012-05-11 LAB — ANAEROBIC CULTURE

## 2014-08-08 ENCOUNTER — Ambulatory Visit
Admission: RE | Admit: 2014-08-08 | Discharge: 2014-08-08 | Disposition: A | Discharge status: Home / Self Care | Payer: Self-pay | Source: Ambulatory Visit | Attending: Family Medicine | Admitting: Family Medicine

## 2014-08-08 ENCOUNTER — Other Ambulatory Visit: Payer: Self-pay | Admitting: Family Medicine

## 2014-08-08 DIAGNOSIS — R55 Syncope and collapse: Secondary | ICD-10-CM

## 2014-08-20 ENCOUNTER — Other Ambulatory Visit: Payer: Self-pay | Admitting: Family Medicine

## 2014-08-20 DIAGNOSIS — R55 Syncope and collapse: Secondary | ICD-10-CM

## 2014-08-21 ENCOUNTER — Ambulatory Visit
Admission: RE | Admit: 2014-08-21 | Discharge: 2014-08-21 | Disposition: A | Discharge status: Home / Self Care | Payer: BLUE CROSS/BLUE SHIELD | Source: Ambulatory Visit | Attending: Family Medicine | Admitting: Family Medicine

## 2014-08-21 ENCOUNTER — Other Ambulatory Visit: Payer: Self-pay | Admitting: Family Medicine

## 2014-08-21 DIAGNOSIS — R059 Cough, unspecified: Secondary | ICD-10-CM

## 2014-08-21 DIAGNOSIS — R05 Cough: Secondary | ICD-10-CM

## 2014-08-21 DIAGNOSIS — R0602 Shortness of breath: Secondary | ICD-10-CM

## 2014-08-21 DIAGNOSIS — R55 Syncope and collapse: Secondary | ICD-10-CM

## 2014-08-23 ENCOUNTER — Other Ambulatory Visit: Payer: Self-pay

## 2014-10-22 ENCOUNTER — Other Ambulatory Visit: Payer: Self-pay | Admitting: Orthopedic Surgery

## 2014-11-02 ENCOUNTER — Encounter (HOSPITAL_BASED_OUTPATIENT_CLINIC_OR_DEPARTMENT_OTHER): Payer: Self-pay | Admitting: *Deleted

## 2014-11-06 ENCOUNTER — Ambulatory Visit (HOSPITAL_BASED_OUTPATIENT_CLINIC_OR_DEPARTMENT_OTHER): Payer: Worker's Compensation | Admitting: Anesthesiology

## 2014-11-06 ENCOUNTER — Ambulatory Visit (HOSPITAL_BASED_OUTPATIENT_CLINIC_OR_DEPARTMENT_OTHER)
Admission: RE | Admit: 2014-11-06 | Discharge: 2014-11-06 | Disposition: A | Discharge status: Home / Self Care | Payer: Worker's Compensation | Source: Ambulatory Visit | Attending: Orthopedic Surgery | Admitting: Orthopedic Surgery

## 2014-11-06 ENCOUNTER — Encounter (HOSPITAL_BASED_OUTPATIENT_CLINIC_OR_DEPARTMENT_OTHER): Payer: Self-pay | Admitting: Orthopedic Surgery

## 2014-11-06 ENCOUNTER — Encounter (HOSPITAL_BASED_OUTPATIENT_CLINIC_OR_DEPARTMENT_OTHER)
Admission: RE | Disposition: A | Discharge status: Home / Self Care | Payer: Self-pay | Source: Ambulatory Visit | Attending: Orthopedic Surgery

## 2014-11-06 DIAGNOSIS — Z8611 Personal history of tuberculosis: Secondary | ICD-10-CM | POA: Diagnosis not present

## 2014-11-06 DIAGNOSIS — J45909 Unspecified asthma, uncomplicated: Secondary | ICD-10-CM | POA: Insufficient documentation

## 2014-11-06 DIAGNOSIS — G5601 Carpal tunnel syndrome, right upper limb: Secondary | ICD-10-CM | POA: Diagnosis not present

## 2014-11-06 DIAGNOSIS — F17210 Nicotine dependence, cigarettes, uncomplicated: Secondary | ICD-10-CM | POA: Insufficient documentation

## 2014-11-06 DIAGNOSIS — Z96652 Presence of left artificial knee joint: Secondary | ICD-10-CM | POA: Diagnosis not present

## 2014-11-06 DIAGNOSIS — Z882 Allergy status to sulfonamides status: Secondary | ICD-10-CM | POA: Diagnosis not present

## 2014-11-06 DIAGNOSIS — M65331 Trigger finger, right middle finger: Secondary | ICD-10-CM | POA: Diagnosis not present

## 2014-11-06 DIAGNOSIS — I10 Essential (primary) hypertension: Secondary | ICD-10-CM | POA: Diagnosis not present

## 2014-11-06 DIAGNOSIS — Z886 Allergy status to analgesic agent status: Secondary | ICD-10-CM | POA: Insufficient documentation

## 2014-11-06 HISTORY — PX: TRIGGER FINGER RELEASE: SHX641

## 2014-11-06 HISTORY — PX: CARPAL TUNNEL RELEASE: SHX101

## 2014-11-06 LAB — POCT HEMOGLOBIN-HEMACUE: HEMOGLOBIN: 14.4 g/dL (ref 12.0–15.0)

## 2014-11-06 SURGERY — RELEASE, A1 PULLEY, FOR TRIGGER FINGER
Anesthesia: Monitor Anesthesia Care | Site: Wrist | Laterality: Right

## 2014-11-06 MED ORDER — HYDROCODONE-ACETAMINOPHEN 5-325 MG PO TABS: 1.0000 | ORAL_TABLET | Freq: Four times a day (QID) | ORAL | Status: AC | PRN

## 2014-11-06 MED ORDER — FENTANYL CITRATE (PF) 100 MCG/2ML IJ SOLN
50.0000 ug | INTRAMUSCULAR | Status: DC | PRN
  Administered 2014-11-06: 100 ug via INTRAVENOUS

## 2014-11-06 MED ORDER — FENTANYL CITRATE (PF) 100 MCG/2ML IJ SOLN
INTRAMUSCULAR | Status: AC
  Filled 2014-11-06: qty 4

## 2014-11-06 MED ORDER — BUPIVACAINE HCL (PF) 0.25 % IJ SOLN
INTRAMUSCULAR | Status: AC
  Filled 2014-11-06: qty 30

## 2014-11-06 MED ORDER — HYDROMORPHONE HCL 1 MG/ML IJ SOLN
0.2500 mg | INTRAMUSCULAR | Status: DC | PRN
  Administered 2014-11-06 (×2): 0.5 mg via INTRAVENOUS

## 2014-11-06 MED ORDER — OXYCODONE HCL 5 MG PO TABS
5.0000 mg | ORAL_TABLET | Freq: Once | ORAL | Status: AC | PRN
  Administered 2014-11-06: 5 mg via ORAL

## 2014-11-06 MED ORDER — CEFAZOLIN SODIUM-DEXTROSE 2-3 GM-% IV SOLR
2.0000 g | INTRAVENOUS | Status: AC
  Administered 2014-11-06: 2 g via INTRAVENOUS

## 2014-11-06 MED ORDER — MIDAZOLAM HCL 2 MG/2ML IJ SOLN
1.0000 mg | INTRAMUSCULAR | Status: DC | PRN
  Administered 2014-11-06: 2 mg via INTRAVENOUS

## 2014-11-06 MED ORDER — SCOPOLAMINE 1 MG/3DAYS TD PT72: 1.0000 | MEDICATED_PATCH | Freq: Once | TRANSDERMAL | Status: DC | PRN

## 2014-11-06 MED ORDER — BUPIVACAINE HCL (PF) 0.25 % IJ SOLN
INTRAMUSCULAR | Status: DC | PRN
  Administered 2014-11-06: 10 mL

## 2014-11-06 MED ORDER — LACTATED RINGERS IV SOLN
INTRAVENOUS | Status: DC
  Administered 2014-11-06: 11:00:00 via INTRAVENOUS

## 2014-11-06 MED ORDER — 0.9 % SODIUM CHLORIDE (POUR BTL) OPTIME
TOPICAL | Status: DC | PRN
  Administered 2014-11-06: 240 mL

## 2014-11-06 MED ORDER — HYDROMORPHONE HCL 1 MG/ML IJ SOLN
INTRAMUSCULAR | Status: AC
  Filled 2014-11-06: qty 1

## 2014-11-06 MED ORDER — CEFAZOLIN SODIUM-DEXTROSE 2-3 GM-% IV SOLR: 2.0000 g | INTRAVENOUS | Status: DC

## 2014-11-06 MED ORDER — CHLORHEXIDINE GLUCONATE 4 % EX LIQD: 60.0000 mL | Freq: Once | CUTANEOUS | Status: DC

## 2014-11-06 MED ORDER — MIDAZOLAM HCL 2 MG/2ML IJ SOLN
INTRAMUSCULAR | Status: AC
  Filled 2014-11-06: qty 2

## 2014-11-06 MED ORDER — DEXAMETHASONE SODIUM PHOSPHATE 10 MG/ML IJ SOLN
INTRAMUSCULAR | Status: DC | PRN
  Administered 2014-11-06: 10 mg via INTRAVENOUS

## 2014-11-06 MED ORDER — CEFAZOLIN SODIUM-DEXTROSE 2-3 GM-% IV SOLR
INTRAVENOUS | Status: AC
  Filled 2014-11-06: qty 50

## 2014-11-06 MED ORDER — OXYCODONE HCL 5 MG/5ML PO SOLN: 5.0000 mg | Freq: Once | ORAL | Status: AC | PRN

## 2014-11-06 MED ORDER — MEPERIDINE HCL 25 MG/ML IJ SOLN: 6.2500 mg | INTRAMUSCULAR | Status: DC | PRN

## 2014-11-06 MED ORDER — GLYCOPYRROLATE 0.2 MG/ML IJ SOLN: 0.2000 mg | Freq: Once | INTRAMUSCULAR | Status: DC | PRN

## 2014-11-06 MED ORDER — OXYCODONE HCL 5 MG PO TABS
ORAL_TABLET | ORAL | Status: AC
  Filled 2014-11-06: qty 1

## 2014-11-06 SURGICAL SUPPLY — 37 items
BANDAGE COBAN STERILE 2 (GAUZE/BANDAGES/DRESSINGS) ×4 IMPLANT
BLADE SURG 15 STRL LF DISP TIS (BLADE) ×2 IMPLANT
BLADE SURG 15 STRL SS (BLADE) ×2
BNDG COHESIVE 3X5 TAN STRL LF (GAUZE/BANDAGES/DRESSINGS) ×4 IMPLANT
BNDG ESMARK 4X9 LF (GAUZE/BANDAGES/DRESSINGS) IMPLANT
BNDG GAUZE ELAST 4 BULKY (GAUZE/BANDAGES/DRESSINGS) ×4 IMPLANT
CHLORAPREP W/TINT 26ML (MISCELLANEOUS) ×4 IMPLANT
CORDS BIPOLAR (ELECTRODE) ×4 IMPLANT
COVER BACK TABLE 60X90IN (DRAPES) ×4 IMPLANT
COVER MAYO STAND STRL (DRAPES) ×4 IMPLANT
CUFF TOURNIQUET SINGLE 18IN (TOURNIQUET CUFF) ×4 IMPLANT
DECANTER SPIKE VIAL GLASS SM (MISCELLANEOUS) ×4 IMPLANT
DRAPE EXTREMITY T 121X128X90 (DRAPE) ×4 IMPLANT
DRAPE SURG 17X23 STRL (DRAPES) ×4 IMPLANT
DRSG PAD ABDOMINAL 8X10 ST (GAUZE/BANDAGES/DRESSINGS) ×4 IMPLANT
GAUZE SPONGE 4X4 12PLY STRL (GAUZE/BANDAGES/DRESSINGS) ×4 IMPLANT
GAUZE XEROFORM 1X8 LF (GAUZE/BANDAGES/DRESSINGS) ×4 IMPLANT
GLOVE BIOGEL PI IND STRL 7.0 (GLOVE) ×4 IMPLANT
GLOVE BIOGEL PI IND STRL 8.5 (GLOVE) ×2 IMPLANT
GLOVE BIOGEL PI INDICATOR 7.0 (GLOVE) ×4
GLOVE BIOGEL PI INDICATOR 8.5 (GLOVE) ×2
GLOVE ECLIPSE 6.5 STRL STRAW (GLOVE) ×8 IMPLANT
GLOVE EXAM NITRILE MD LF STRL (GLOVE) ×4 IMPLANT
GLOVE SURG ORTHO 8.0 STRL STRW (GLOVE) ×4 IMPLANT
GOWN STRL REUS W/ TWL LRG LVL3 (GOWN DISPOSABLE) ×2 IMPLANT
GOWN STRL REUS W/TWL LRG LVL3 (GOWN DISPOSABLE) ×2
GOWN STRL REUS W/TWL XL LVL3 (GOWN DISPOSABLE) ×4 IMPLANT
NEEDLE PRECISIONGLIDE 27X1.5 (NEEDLE) ×4 IMPLANT
NS IRRIG 1000ML POUR BTL (IV SOLUTION) ×4 IMPLANT
PACK BASIN DAY SURGERY FS (CUSTOM PROCEDURE TRAY) ×4 IMPLANT
STOCKINETTE 4X48 STRL (DRAPES) ×4 IMPLANT
SUT ETHILON 4 0 PS 2 18 (SUTURE) ×4 IMPLANT
SUT VICRYL 4-0 PS2 18IN ABS (SUTURE) IMPLANT
SYR BULB 3OZ (MISCELLANEOUS) ×4 IMPLANT
SYR CONTROL 10ML LL (SYRINGE) ×4 IMPLANT
TOWEL OR 17X24 6PK STRL BLUE (TOWEL DISPOSABLE) ×4 IMPLANT
UNDERPAD 30X30 (UNDERPADS AND DIAPERS) ×4 IMPLANT

## 2014-11-06 NOTE — Discharge Instructions (Addendum)

## 2014-11-06 NOTE — Anesthesia Postprocedure Evaluation (Signed)
  Anesthesia Post-op Note  Patient: Kristy Young  Procedure(s) Performed: Procedure(s) with comments: RELEASE TRIGGER FINGER/A-1 PULLEY RIGHT LONG FINGER (Right) - REG/FAB RIGHT CARPAL TUNNEL RELEASE (Right) - REG/FAB  Patient Location: PACU  Anesthesia Type: MAC, Bier Block   Level of Consciousness: awake, alert  and oriented  Airway and Oxygen Therapy: Patient Spontanous Breathing  Post-op Pain: mild  Post-op Assessment: Post-op Vital signs reviewed  Post-op Vital Signs: Reviewed  Last Vitals:  Filed Vitals:   11/06/14 1245  BP: 108/43  Pulse: 64  Temp: 36.5 C  Resp: 18    Complications: No apparent anesthesia complications

## 2014-11-06 NOTE — Anesthesia Preprocedure Evaluation (Signed)
Anesthesia Evaluation  Patient identified by MRN, date of birth, ID band Patient awake    Reviewed: Allergy & Precautions, NPO status   Airway Mallampati: I  TM Distance: >3 FB Neck ROM: Full    Dental  (+) Upper Dentures, Lower Dentures, Dental Advisory Given   Pulmonary asthma , Current Smoker,  breath sounds clear to auscultation        Cardiovascular hypertension, Pt. on medications Rhythm:Regular Rate:Normal     Neuro/Psych    GI/Hepatic   Endo/Other    Renal/GU      Musculoskeletal   Abdominal   Peds  Hematology   Anesthesia Other Findings   Reproductive/Obstetrics                             Anesthesia Physical Anesthesia Plan  ASA: III  Anesthesia Plan: MAC and Bier Block   Post-op Pain Management:    Induction: Intravenous  Airway Management Planned: Simple Face Mask  Additional Equipment:   Intra-op Plan:   Post-operative Plan:   Informed Consent: I have reviewed the patients History and Physical, chart, labs and discussed the procedure including the risks, benefits and alternatives for the proposed anesthesia with the patient or authorized representative who has indicated his/her understanding and acceptance.   Dental advisory given  Plan Discussed with: CRNA, Anesthesiologist and Surgeon  Anesthesia Plan Comments:         Anesthesia Quick Evaluation

## 2014-11-06 NOTE — Brief Op Note (Signed)
11/06/2014  12:04 PM  PATIENT:  Kristy Young  64 y.o. female  PRE-OPERATIVE DIAGNOSIS:  RIGHT LONG TRIGGER FINGER,RIGHT CARPAL TUNNEL SYNDROME  POST-OPERATIVE DIAGNOSIS:  RIGHT LONG TRIGGER FINGER,RIGHT CARPAL TUNNEL SYNDROME  PROCEDURE:  Procedure(s) with comments: RELEASE TRIGGER FINGER/A-1 PULLEY RIGHT LONG FINGER (Right) - REG/FAB RIGHT CARPAL TUNNEL RELEASE (Right) - REG/FAB  SURGEON:  Surgeon(s) and Role:    * Cindee Salt, MD - Primary  PHYSICIAN ASSISTANT:   ASSISTANTS: none   ANESTHESIA:   local and regional  EBL:  Total I/O In: 200 [I.V.:200] Out: -   BLOOD ADMINISTERED:none  DRAINS: none   LOCAL MEDICATIONS USED:  BUPIVICAINE   SPECIMEN:  No Specimen  DISPOSITION OF SPECIMEN:  N/A  COUNTS:  YES  TOURNIQUET:   Total Tourniquet Time Documented: Forearm (Right) - 27 minutes Total: Forearm (Right) - 27 minutes   DICTATION: .Other Dictation: Dictation Number [id number]  PLAN OF CARE: Discharge to home after PACU  PATIENT DISPOSITION:  PACU - hemodynamically stable.

## 2014-11-06 NOTE — Op Note (Signed)
Dictation Number [id number]

## 2014-11-06 NOTE — Transfer of Care (Signed)
Immediate Anesthesia Transfer of Care Note  Patient: Kristy Young  Procedure(s) Performed: Procedure(s) with comments: RELEASE TRIGGER FINGER/A-1 PULLEY RIGHT LONG FINGER (Right) - REG/FAB RIGHT CARPAL TUNNEL RELEASE (Right) - REG/FAB  Patient Location: PACU  Anesthesia Type:MAC  Level of Consciousness: awake and alert   Airway & Oxygen Therapy: Patient Spontanous Breathing and Patient connected to face mask oxygen  Post-op Assessment: Report given to RN and Post -op Vital signs reviewed and stable  Post vital signs: Reviewed and stable  Last Vitals:  Filed Vitals:   11/06/14 1036  BP: 160/64  Pulse: 67  Temp: 36.7 C  Resp: 18    Complications: No apparent anesthesia complications

## 2014-11-06 NOTE — H&P (Signed)
[name] is a 64 year-old left-hand dominant female who comes in complaining of catching of her right middle finger.  This has been going on approximately six months.  She is also complaining of some numbness and tingling to the thumb through ring fingers on that side. Some swelling on the dorsal aspect of her hand, pain over the posterior aspect of her shoulder.  She has history of pseudogout, no history of injury to the hand or to the neck.  She is awakened 7/7 nights.  She has had knee replacements done two years ago.  She has had trigger finger released six years ago in [place], [place].  She has no history of diabetes, thyroid problems or gout.  She has been taking Advil.  She complains of intermittent, severe pain especially in the middle fingers, stabbing, aching in nature with a feeling of swelling and weakness.  She states it is getting worse.  Activity makes it worse along with work.  Rest makes it better.    ALLERGIES:     Aspirin.    MEDICATIONS:    None.  SURGICAL HISTORY:   Knee replacement surgery done in [place], [place].    SOCIAL HISTORY:     She smokes  PPD and is advised to quit with the reasons behind this.  She drinks socially.  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  REVIEW OF SYSTEMS:     Positive for glasses, high blood pressure, asthma, TB, blood in her urine, easy bruising otherwise all systems negative.   Kristy Young is an 64 y.o. female.   Chief Complaint: catching right middle finger with numbness right hand HPI: see above  Past Medical History  Diagnosis Date  . Hematuria   . Hypertension   . Asthma     mainly with stress or colds    Past Surgical History  Procedure Laterality Date  . Abdominal hysterectomy  AGE 59  . Laparoscopic bilateral salpingo-oophectomy  2002  . Total knee arthroplasty  2005    LEFT  . Breast lumpectomy  2006    BENIGN  . Cyst removal hand    . Total knee revision Left 05/06/2012    Procedure: TOTAL KNEE REVISION;   Surgeon: Kristy Junior, MD;  Location: [facility] OR;  Service: Orthopedics;  Laterality: Left;  PRE OP FEMORAL NERVE BLOCK    History reviewed. No pertinent family history. Social History:  reports that she has been smoking Cigarettes.  She has a 10 pack-year smoking history. She has never used smokeless tobacco. She reports that she drinks alcohol. She reports that she does not use illicit drugs.  Allergies:  Allergies  Allergen Reactions  . Adhesive [Tape] Other (See Comments)    IRRIATAION  . Asa [Aspirin]   . Sulfa Antibiotics Hives    No prescriptions prior to admission    No results found for this or any previous visit (from the past 48 hour(s)).  No results found.   Pertinent items are noted in HPI.  Height  (1.6 m), weight 74.844 kg (165 lb).  General appearance: alert, cooperative and appears stated age Head: Normocephalic, without obvious abnormality Neck: no JVD Resp: clear to auscultation bilaterally Cardio: regular rate and rhythm, S1, S2 normal, no murmur, click, rub or gallop GI: soft, non-tender; bowel sounds normal; no masses,  no organomegaly Extremities: catching right middle finger with numbness right hand Pulses: 2+ and symmetric Skin: Skin color, texture, turgor normal. No rashes or lesions Neurologic:  Grossly normal Incision/Wound: na  Assessment/Plan  DIAGNOSIS:     Trigger finger, right middle finger.    This locks and is easily disengaged.   RECOMMENDATIONS/PLAN:     She has had this finger injected on one occasion and is not particularly desirous of further injections and would like to have this operated on. With her questionable carpal tunnel syndrome we would recommend nerve conductions be performed.   NERVE CONDUCTION STUDIES:    Nerve conduction studies were performed by Dr. Johna Roles revealing motor delay of 5.7/left and 7.7/right and a sensory delay of 3.5/left and 3.8/right; amplitude diminution to 9.6/left and 8.0/right.  We have discussed  this with her.    We would recommend she seriously consider surgical release of the A-1 pulley which she wants to have done along with carpal tunnel release of her right carpal canal.   The pre, peri and postoperative course were discussed along with the risks and complications.  The patient is aware there is no guarantee with the surgery, possibility of infection, recurrence, injury to arteries, nerves, tendons, incomplete relief of symptoms and dystrophy.     She isscheduled for release of the A-1 pulley right middle finger along with carpal tunnel release right hand. Kristy Young R 11/06/2014, 9:28 AM

## 2014-11-06 NOTE — Anesthesia Procedure Notes (Signed)
Anesthesia Regional Block:  Bier block ([facility] Regional)  Pre-Anesthetic Checklist: ,, timeout performed, Correct Patient, Correct Site, Correct Laterality, Correct Procedure,, site marked, surgical consent,, at surgeon's request Needles:  Injection technique: Single-shot  Needle Type: Other      Needle Gauge: 20 and 20 G    Additional Needles: Bier block ([facility]) Narrative:  Start time: 11/06/2014 11:29 AM End time: 11/06/2014 11:29 AM Injection made incrementally with aspirations every 35 mL.  Performed by: Personally   Additional Notes: Esmark wrap, torq inflated 290, neg pulse, slowly injected 0.5% pres free [place]. Pt tol well, no neg s\s

## 2014-11-07 ENCOUNTER — Encounter (HOSPITAL_BASED_OUTPATIENT_CLINIC_OR_DEPARTMENT_OTHER): Payer: Self-pay | Admitting: Orthopedic Surgery

## 2014-11-07 NOTE — Op Note (Signed)
Kristy Young, Kristy Young                   ACCOUNT NO.:  [id number]  MEDICAL RECORD NO.:  [id number]  LOCATION:                                 FACILITY:  PHYSICIAN:  Cindee Salt, M.D.            DATE OF BIRTH:  DATE OF PROCEDURE:  11/06/2014 DATE OF DISCHARGE:                              OPERATIVE REPORT   PREOPERATIVE DIAGNOSIS:  Stenosing tenosynovitis with triggering, right middle finger; carpal tunnel syndrome, right hand.  POSTOPERATIVE DIAGNOSIS:  Stenosing tenosynovitis with triggering, right middle finger; carpal tunnel syndrome, right hand.  OPERATION:  Release of A1 pulley, right middle finger; release of median nerve, right wrist.  SURGEON:  Cindee Salt, M.D.  ANESTHESIA:  Forearm IV regional with local infiltration.  ANESTHESIOLOGIST:  Sheldon Silvan, M.D.  HISTORY:  The patient is a 64 year old female with a history of numbness and tingling of her fingers, triggering of her right middle finger. This has not responded to conservative treatment.  She has elected to undergo surgical release of the A1 pulley, triggering of her right middle finger, and carpal tunnel release of right hand.  Nerve conductions are positive.  She is aware that there is no guarantee with the surgery; possibility of infection; recurrence of injury to arteries, nerves, tendons; incomplete relief of symptoms; dystrophy.  In preoperative area, the patient is seen, the extremity marked by both the patient and surgeon.  Antibiotic given.  PROCEDURE IN DETAIL:  The patient was brought to the operating room, where a forearm-based IV regional anesthetic was carried out without difficulty.  She was prepped using ChloraPrep, supine position with the right arm free.  A 3-minute dry time was allowed.  Time-out taken, confirming the patient and procedure.  An oblique incision was made over the A1 pulley of the right middle finger, carried down through subcutaneous tissue.  Bleeders were electrocauterized with  bipolar.  The A1 pulley was noted to be thickened.  A cyst was present distally, this was excised.  The A1 pulley was released on its radial aspect.  A small incision was made centrally in A2.  Partial tenosynovectomy performed proximally.  The finger was placed through a full range motion, no further triggering was noted.  Retractors had been placed beginning at this portion of the procedure protecting neurovascular bundles both radially and ulnarly.  With full flexion and extension passively, there was no further triggering noted.  The wound was irrigated.  A longitudinal incision was then made in the right palm, carried down through subcutaneous tissue.  Bleeders again electrocauterized.  Palmar fascia was split.  Superficial palmar arch identified.  Flexor tendon of the ring and little finger identified to the ulnar side of the median nerve.  Carpal retinaculum was incised with sharp dissection.  Right angle and Sewall retractor were placed between skin and forearm fascia. The fascia was released for approximately 2 cm proximal to the wrist crease under direct vision.  Canal was explored, the nerve was noted to be bifurcated.  There was a palmaris profundus tendon, which was present.  The second portion of the median nerve was identified radial to the profundus of  the palmaris tendon.  This was released.  The area compression to the nerve was apparent.  Motor branch entered into muscle distally.  The wound was copiously irrigated with saline.  The skin was then closed with interrupted 4-0 nylon sutures.  On each wound, a local infiltration with 0.25% bupivacaine without epinephrine was given, approximately 10 mL was used.  A sterile compressive dressing with the fingers free was applied.  On deflation of the tourniquet, all fingers immediately pinked.  She was taken to the recovery room for observation in satisfactory condition.  She will be discharged home to return to the [facility] of [place] in 1 week, on Norco.          ______________________________ Cindee Salt, M.D.     GK/MEDQ  D:  11/06/2014  T:  11/07/2014  Job:  [id number]

## 2014-11-09 ENCOUNTER — Other Ambulatory Visit: Payer: Self-pay | Admitting: Family Medicine

## 2014-11-09 DIAGNOSIS — Z1231 Encounter for screening mammogram for malignant neoplasm of breast: Secondary | ICD-10-CM

## 2015-10-05 IMAGING — CT CT HEAD W/O CM
2 series · 16 of 30 positions shown, 19 images · non-contrast
Comparison: No priors.

CLINICAL DATA: 63-year-old female with syncopal episode 3 weeks ago
with potential injury to the right side of the head. Blurred vision
and headaches since that time.

EXAM:
CT HEAD WITHOUT CONTRAST
TECHNIQUE: Contiguous axial images were obtained from the base of the skull
through the vertex without intravenous contrast.

[Series 2: head w/(date) · axial · 0.45mm/px · z∈[-129,-9]mm · 9 of 31 slices shown, 12 images]
[im 4/31  brain]
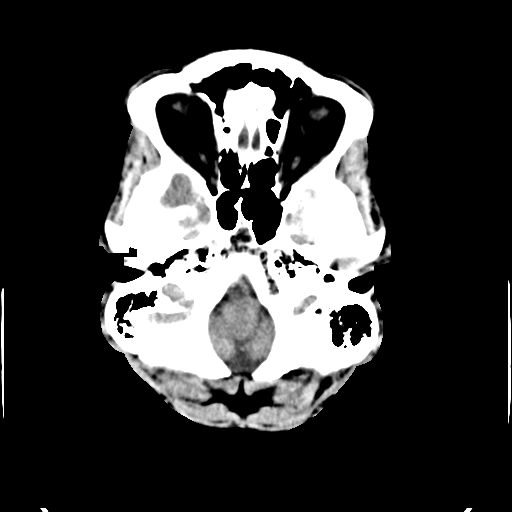
[im 4/31  bone]
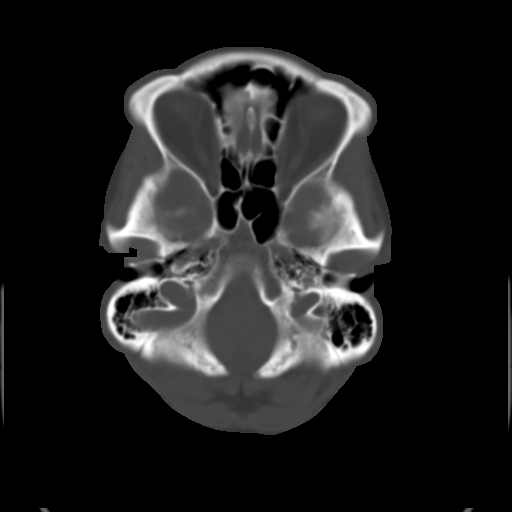
[im 7/31  brain]
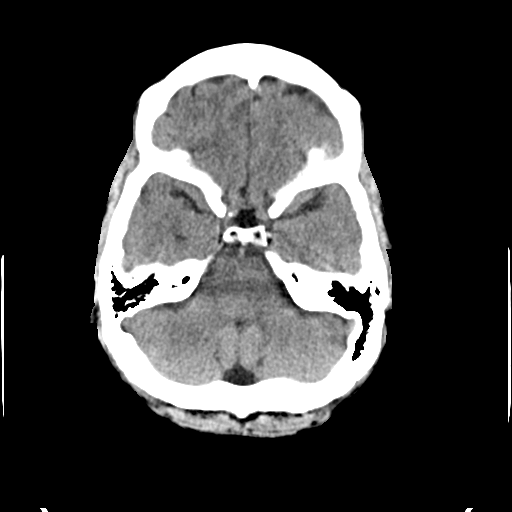
[im 10/31  brain]
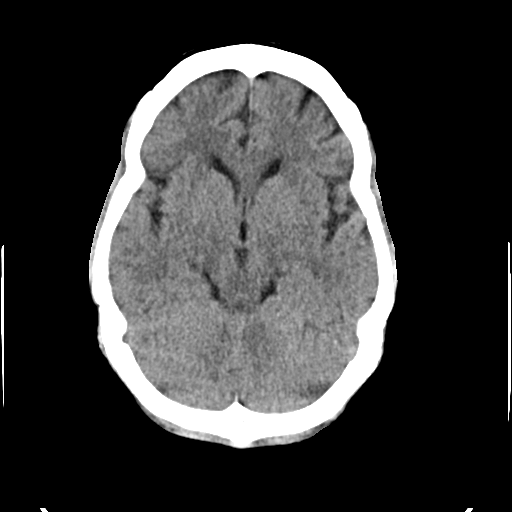
[im 13/31  brain]
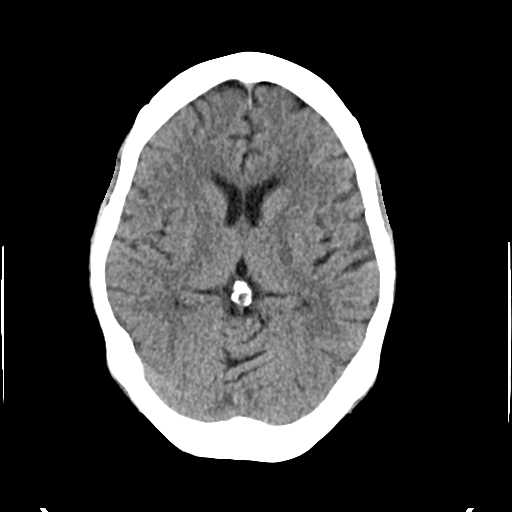
[im 16/31  brain]
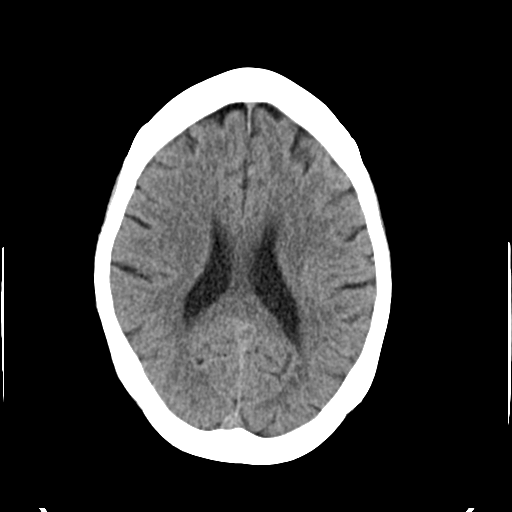
[im 16/31  bone]
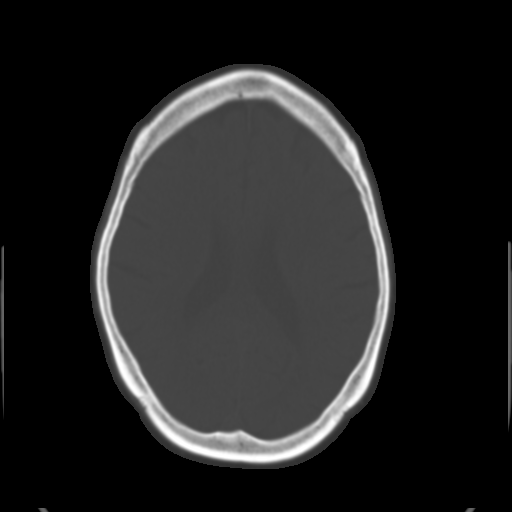
[im 19/31  brain]
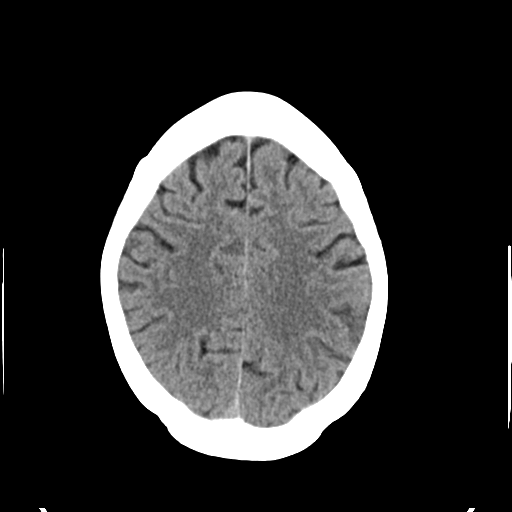
[im 22/31  brain]
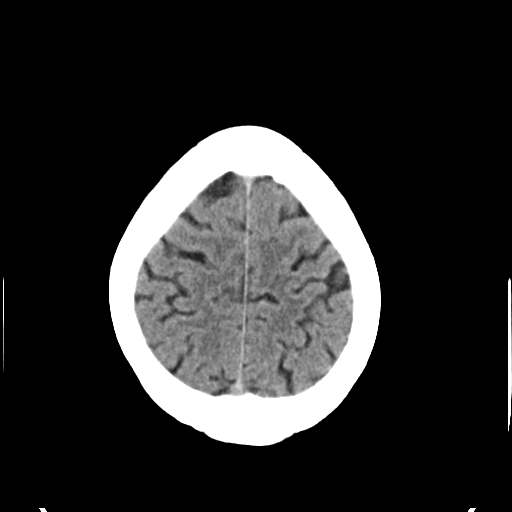
[im 25/31  brain]
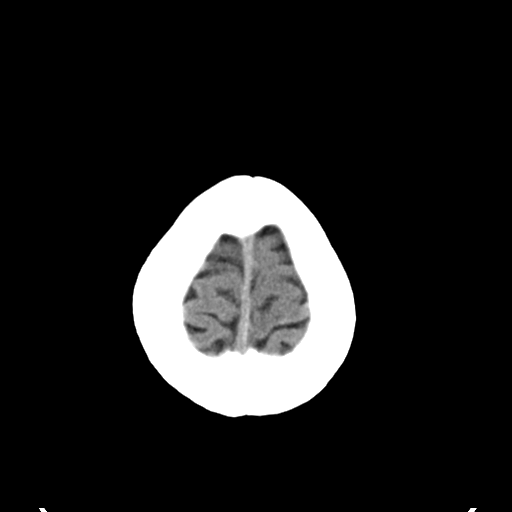
[im 28/31  brain]
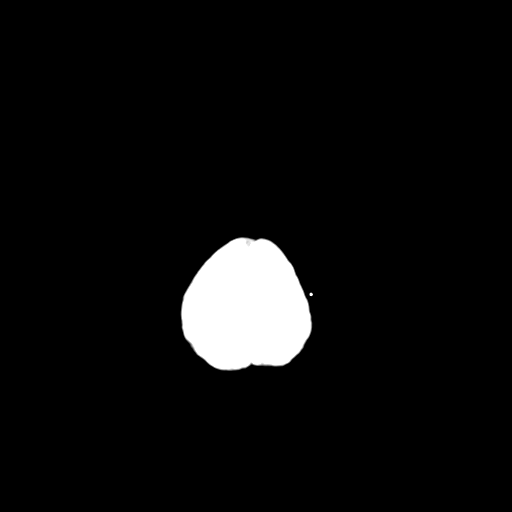
[im 28/31  bone]
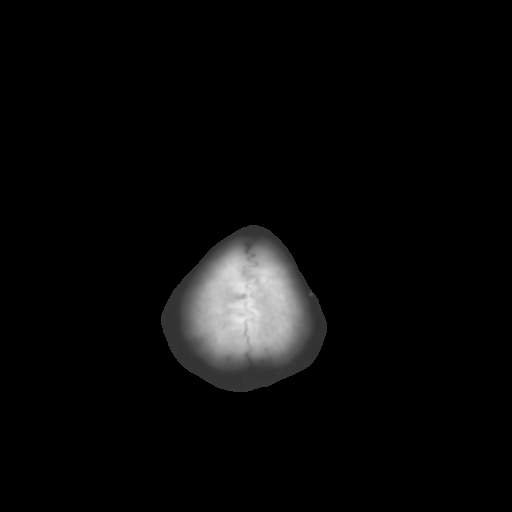

[Series 4: 3mm bone · axial · 0.45mm/px · z∈[-130,-28]mm · 7 of 52 slices shown]
[im 6/52  bone]
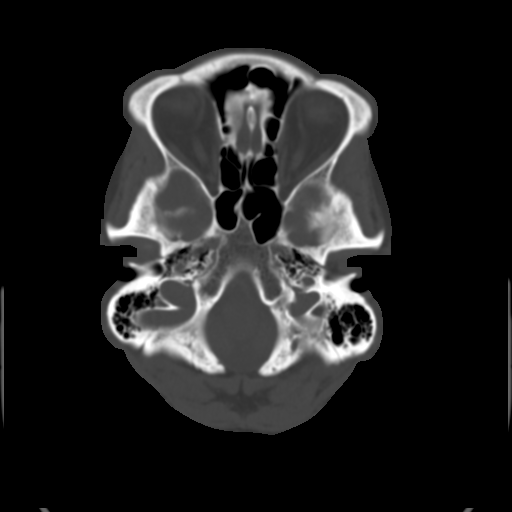
[im 12/52  bone]
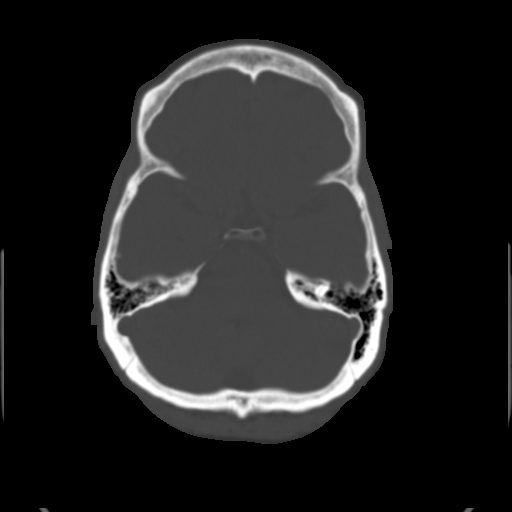
[im 18/52  bone]
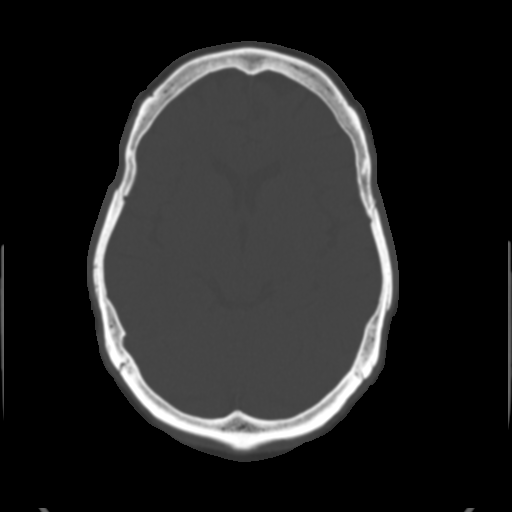
[im 23/52  bone]
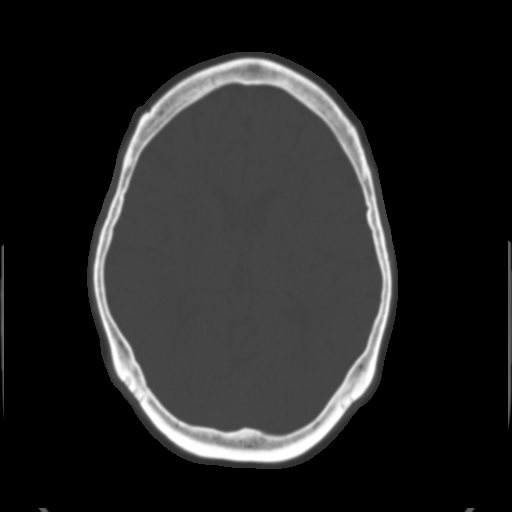
[im 29/52  bone]
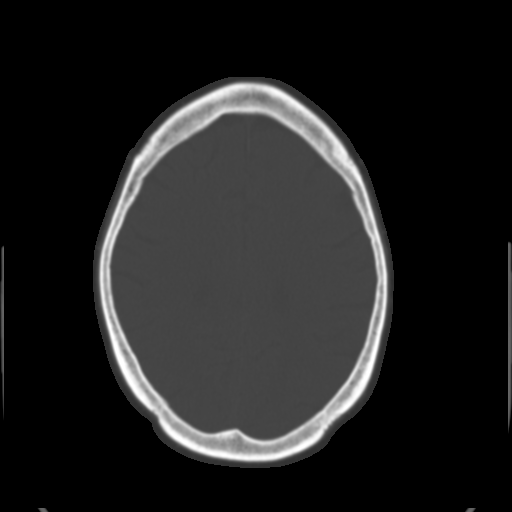
[im 35/52  bone]
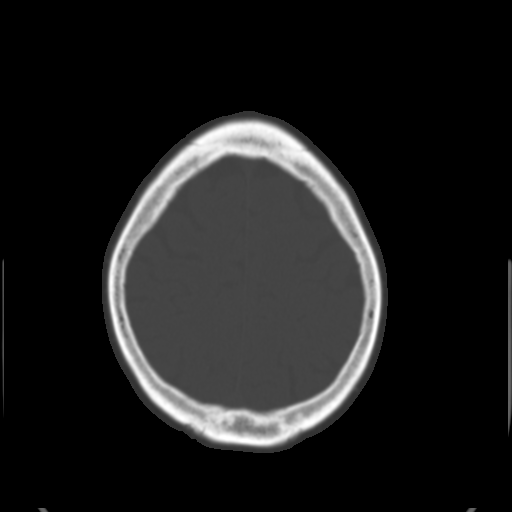
[im 40/52  bone]
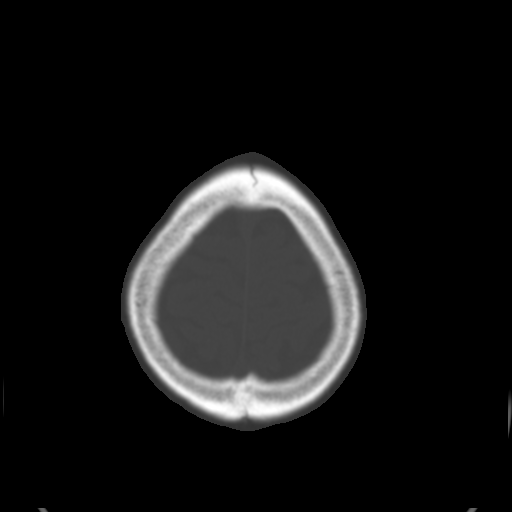

[16 of 30 positions shown; findings below may reference images not displayed]

FINDINGS: Patchy areas of mild decreased attenuation are noted throughout the
deep and periventricular white matter of the cerebral hemispheres
bilaterally, compatible with mild chronic microvascular ischemic
disease. No acute displaced skull fractures are identified. No acute
intracranial abnormality. Specifically, no evidence of acute
post-traumatic intracranial hemorrhage, no definite regions of
acute/subacute cerebral ischemia, no focal mass, mass effect,
hydrocephalus or abnormal intra or extra-axial fluid collections.
The visualized paranasal sinuses and mastoids are well pneumatized.
IMPRESSION: 1. No signs of significant acute traumatic injury to the skull or
brain.
2. Mild chronic microvascular ischemic disease in the cerebral white
matter, as above.

## 2015-10-05 IMAGING — CR DG CHEST 2V
2 series · 2 of 2 positions shown · non-contrast
Comparison: Chest x-ray 04/29/2012.

CLINICAL DATA: 63-year-old female with history of cough and
shortness breath for 1 month. Asthma. Smoker.

EXAM:
CHEST  2 VIEW

[w chest pa]
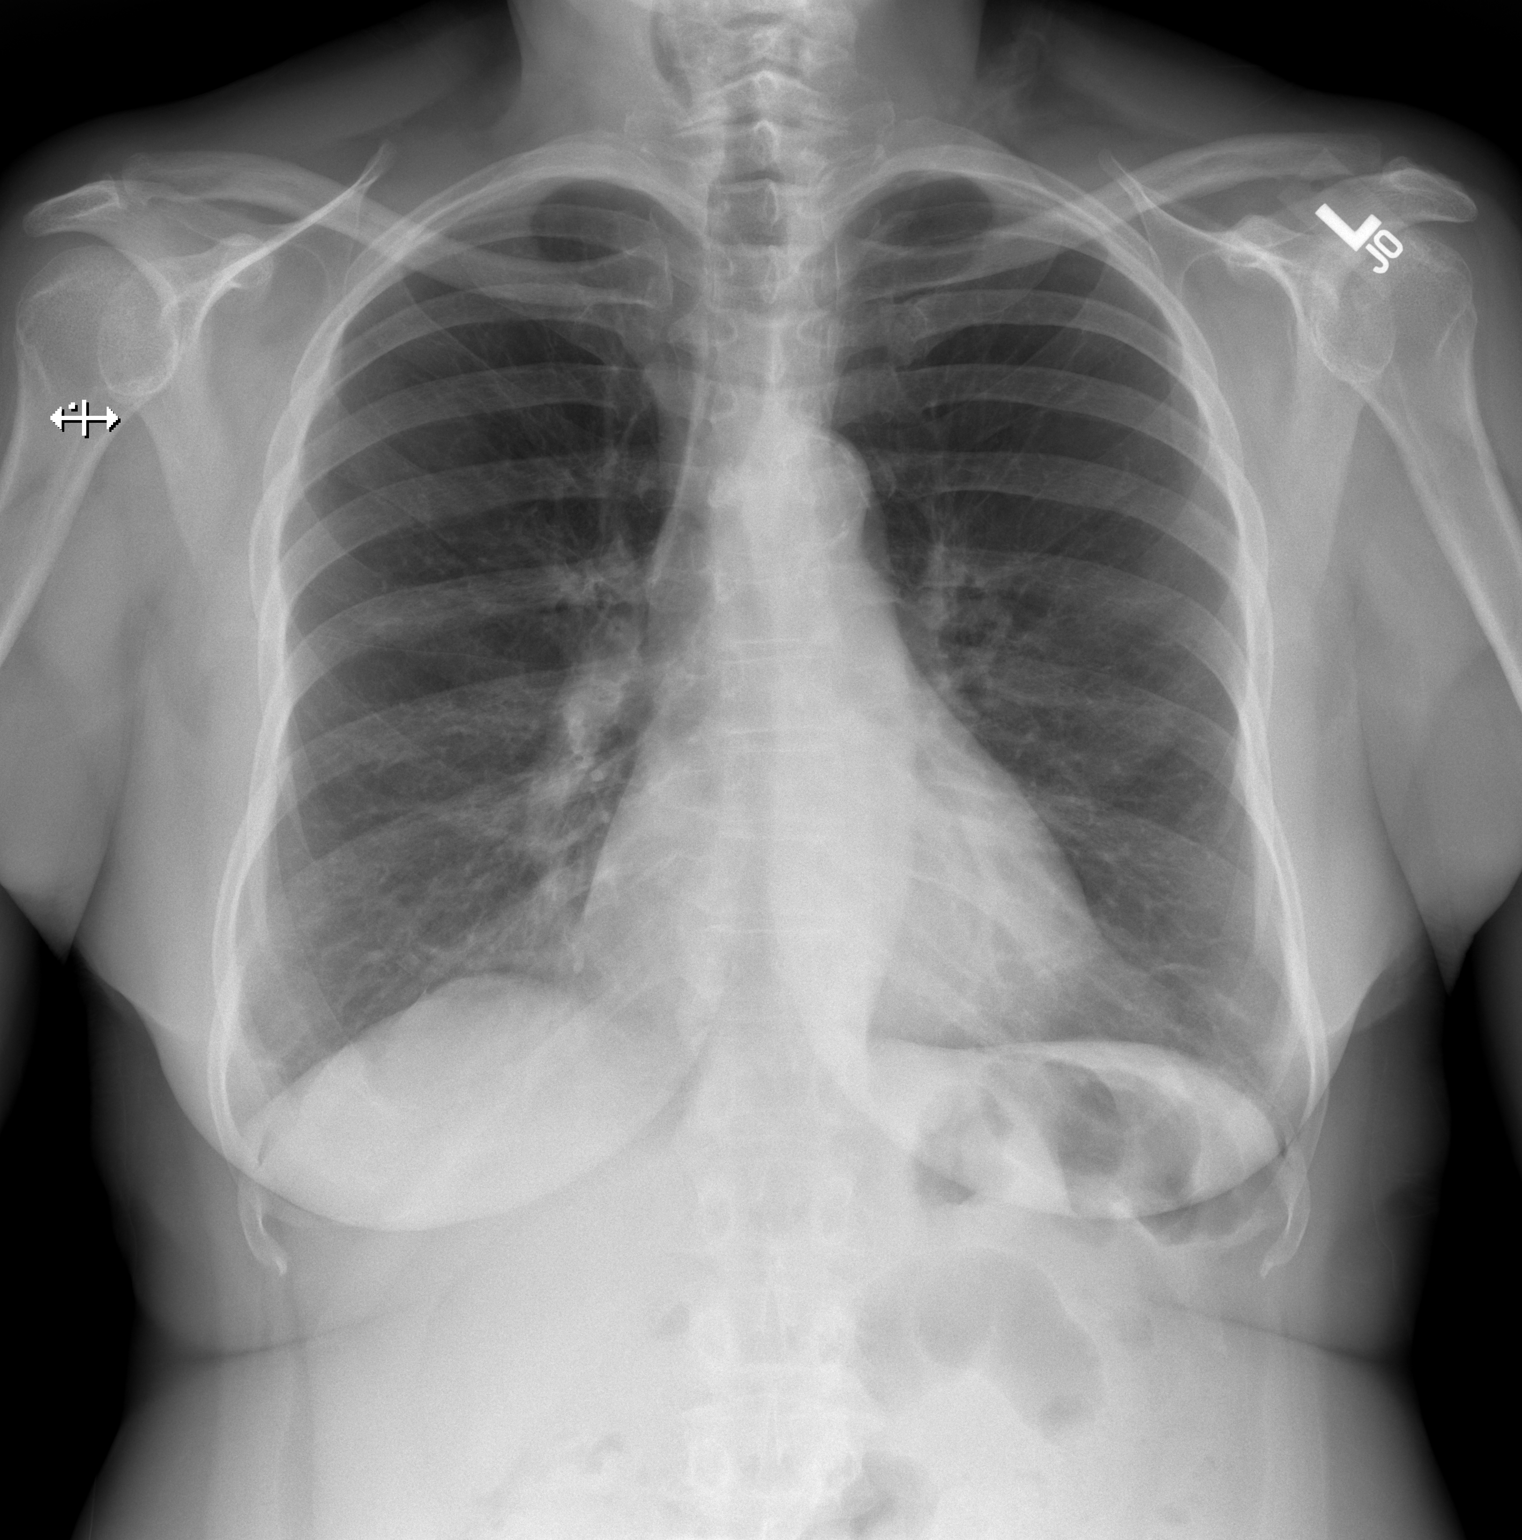

[w chest lat]
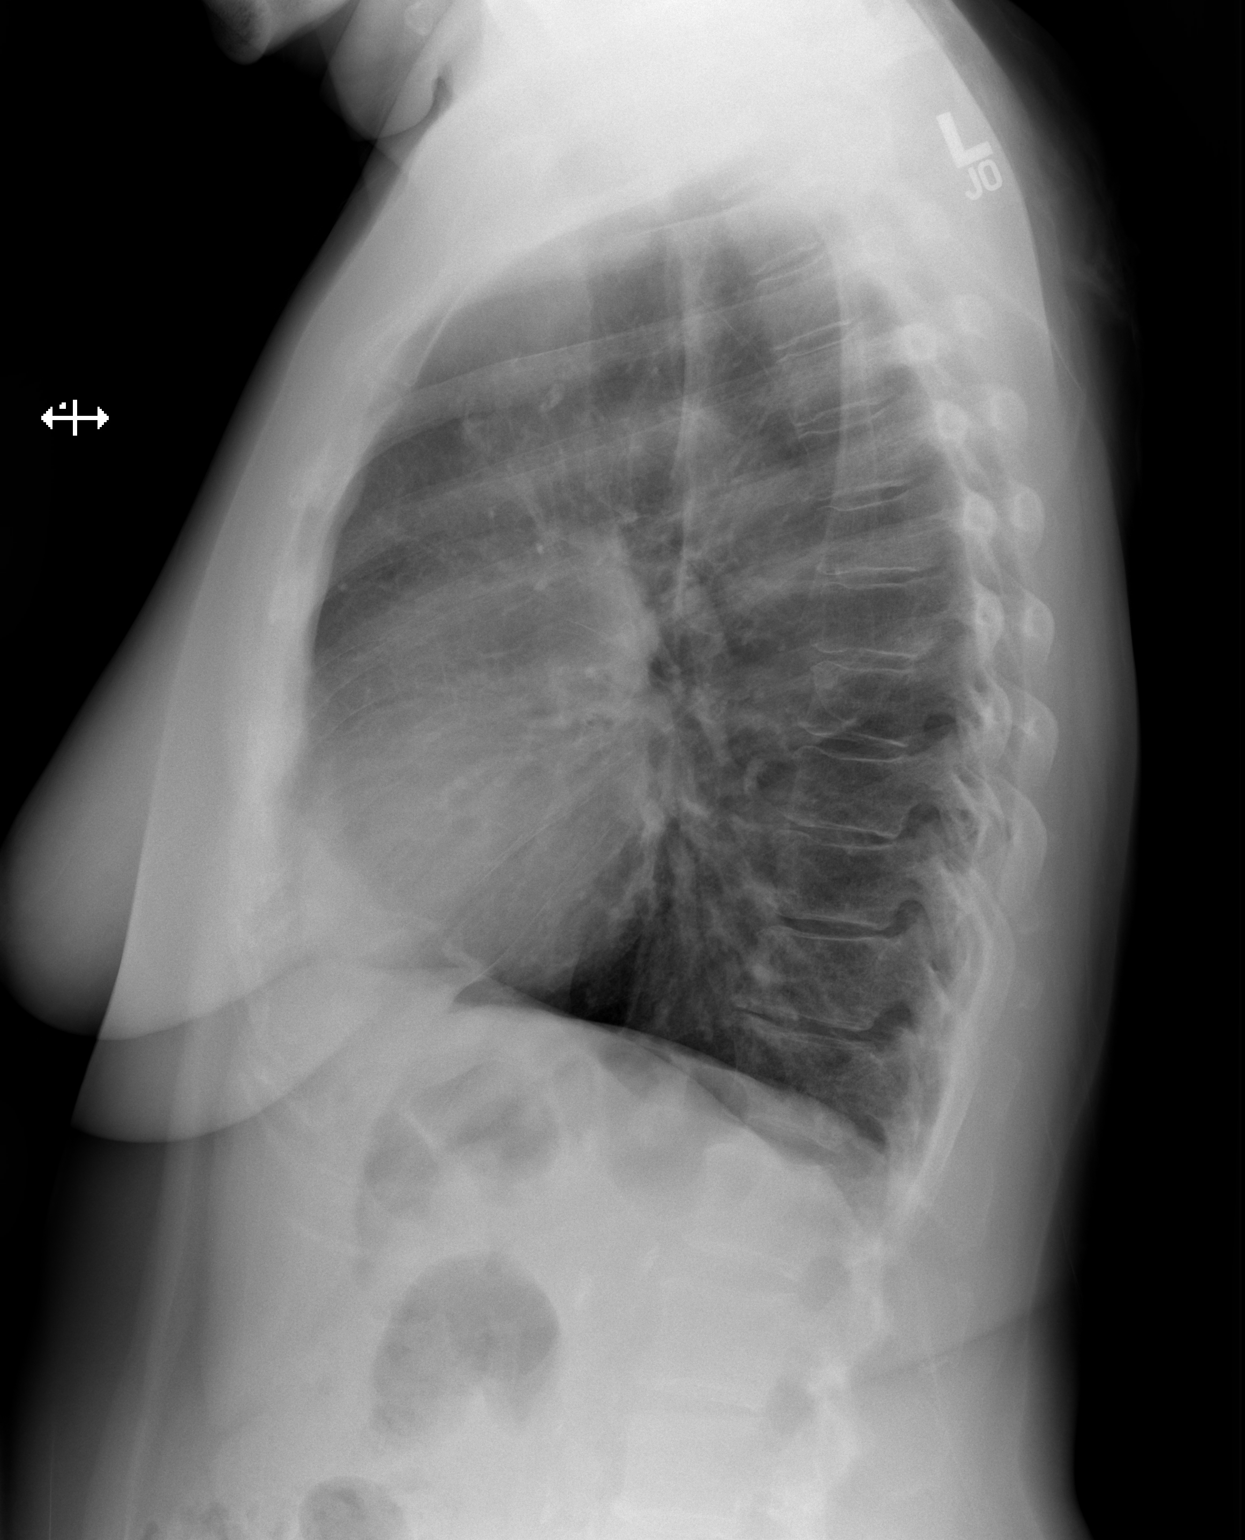

[2 of 2 positions shown; findings below may reference images not displayed]

FINDINGS: Lung volumes are normal. No consolidative airspace disease. No
pleural effusions. No pneumothorax. No pulmonary nodule or mass
noted. Pulmonary vasculature and the cardiomediastinal silhouette
are within normal limits. Atherosclerosis in the thoracic aorta.
IMPRESSION: 1. No radiographic evidence of acute cardiopulmonary disease.
2. Atherosclerosis.
